# Patient Record
Sex: Male | Born: 1970
Health system: Southern US, Community
[De-identification: ages and names within clinical notes are randomized; demographics above are authoritative.]

## PROBLEM LIST (undated history)

## (undated) DIAGNOSIS — I209 Angina pectoris, unspecified: Secondary | ICD-10-CM

---

## 2016-01-05 DIAGNOSIS — A539 Syphilis, unspecified: Secondary | ICD-10-CM | POA: Diagnosis not present

## 2016-01-05 DIAGNOSIS — Z87891 Personal history of nicotine dependence: Secondary | ICD-10-CM | POA: Diagnosis not present

## 2016-01-05 DIAGNOSIS — Z79899 Other long term (current) drug therapy: Secondary | ICD-10-CM | POA: Diagnosis not present

## 2016-01-05 DIAGNOSIS — B20 Human immunodeficiency virus [HIV] disease: Secondary | ICD-10-CM | POA: Diagnosis not present

## 2016-02-22 MED FILL — ESTRADIOL 2 MG TABLET: 2 | 30 days supply | Qty: 60 | Fill #0

## 2016-02-22 MED FILL — SPIRONOLACTONE 25 MG TABLET: 25 | 30 days supply | Qty: 60 | Fill #0

## 2016-03-06 DIAGNOSIS — E349 Endocrine disorder, unspecified: Secondary | ICD-10-CM | POA: Diagnosis not present

## 2016-03-08 MED FILL — TRUVADA 200-300 MG TABS: 200-300 | 30 days supply | Qty: 30 | Fill #0

## 2016-03-08 MED FILL — PREZISTA 800 MG TABS: 800 | 30 days supply | Qty: 30 | Fill #0

## 2016-03-22 MED FILL — SPIRONOLACTONE 25 MG TABLET: 25 | 30 days supply | Qty: 60 | Fill #1

## 2016-03-22 MED FILL — ESTRADIOL 2 MG TABLET: 2 | 30 days supply | Qty: 60 | Fill #1

## 2016-04-04 DIAGNOSIS — Z21 Asymptomatic human immunodeficiency virus [HIV] infection status: Secondary | ICD-10-CM | POA: Diagnosis not present

## 2016-04-04 DIAGNOSIS — Z87891 Personal history of nicotine dependence: Secondary | ICD-10-CM | POA: Diagnosis not present

## 2016-04-04 DIAGNOSIS — F649 Gender identity disorder, unspecified: Secondary | ICD-10-CM | POA: Diagnosis not present

## 2016-04-04 DIAGNOSIS — B20 Human immunodeficiency virus [HIV] disease: Secondary | ICD-10-CM | POA: Diagnosis not present

## 2016-04-04 DIAGNOSIS — B079 Viral wart, unspecified: Secondary | ICD-10-CM | POA: Diagnosis not present

## 2016-04-07 MED FILL — GENVOYA TABLET: 150-150-200 | 30 days supply | Qty: 30 | Fill #0

## 2016-04-18 MED FILL — SPIRONOLACTONE 25 MG TABLET: 25 | 30 days supply | Qty: 60 | Fill #0

## 2016-04-18 MED FILL — ESTRADIOL 2 MG TABLET: 2 | 30 days supply | Qty: 60 | Fill #0

## 2016-05-16 MED FILL — GENVOYA TABLET: 150-150-200 | 30 days supply | Qty: 30 | Fill #1

## 2016-05-24 MED FILL — MELOXICAM 15 MG TABLET: 15 | 30 days supply | Qty: 30 | Fill #0

## 2016-06-14 MED FILL — GENVOYA TABLET: 150-150-200 | 30 days supply | Qty: 30 | Fill #2 | Status: TO

## 2016-06-14 MED FILL — ESTRADIOL 2 MG TABLET: 2 | 30 days supply | Qty: 60 | Fill #1

## 2016-06-21 MED FILL — MELOXICAM 15 MG TABLET: 15 | 30 days supply | Qty: 30 | Fill #1

## 2016-07-10 DIAGNOSIS — B20 Human immunodeficiency virus [HIV] disease: Secondary | ICD-10-CM | POA: Diagnosis not present

## 2021-05-15 ENCOUNTER — Inpatient Hospital Stay (HOSPITAL_COMMUNITY): Payer: No Typology Code available for payment source

## 2021-05-15 ENCOUNTER — Emergency Department (HOSPITAL_COMMUNITY): Payer: No Typology Code available for payment source

## 2021-05-15 ENCOUNTER — Encounter (HOSPITAL_COMMUNITY): Payer: Self-pay | Admitting: Emergency Medicine

## 2021-05-15 ENCOUNTER — Inpatient Hospital Stay (HOSPITAL_COMMUNITY)
Admission: EM | Admit: 2021-05-15 | Discharge: 2021-05-17 | DRG: 247 | Disposition: A | Discharge status: Home / Self Care | Payer: No Typology Code available for payment source | Attending: Cardiology | Admitting: Cardiology

## 2021-05-15 ENCOUNTER — Other Ambulatory Visit: Payer: Self-pay

## 2021-05-15 ENCOUNTER — Encounter (HOSPITAL_COMMUNITY)
Admission: EM | Disposition: A | Discharge status: Home / Self Care | Payer: Self-pay | Source: Home / Self Care | Attending: Cardiology

## 2021-05-15 DIAGNOSIS — I2129 ST elevation (STEMI) myocardial infarction involving other sites: Secondary | ICD-10-CM | POA: Diagnosis present

## 2021-05-15 DIAGNOSIS — F17210 Nicotine dependence, cigarettes, uncomplicated: Secondary | ICD-10-CM | POA: Diagnosis present

## 2021-05-15 DIAGNOSIS — E785 Hyperlipidemia, unspecified: Secondary | ICD-10-CM | POA: Diagnosis present

## 2021-05-15 DIAGNOSIS — Z955 Presence of coronary angioplasty implant and graft: Secondary | ICD-10-CM

## 2021-05-15 DIAGNOSIS — I25110 Atherosclerotic heart disease of native coronary artery with unstable angina pectoris: Secondary | ICD-10-CM | POA: Diagnosis present

## 2021-05-15 DIAGNOSIS — I2510 Atherosclerotic heart disease of native coronary artery without angina pectoris: Secondary | ICD-10-CM | POA: Diagnosis not present

## 2021-05-15 DIAGNOSIS — R0602 Shortness of breath: Secondary | ICD-10-CM

## 2021-05-15 DIAGNOSIS — B20 Human immunodeficiency virus [HIV] disease: Secondary | ICD-10-CM | POA: Diagnosis present

## 2021-05-15 DIAGNOSIS — Z79899 Other long term (current) drug therapy: Secondary | ICD-10-CM | POA: Diagnosis not present

## 2021-05-15 DIAGNOSIS — Z720 Tobacco use: Secondary | ICD-10-CM | POA: Diagnosis present

## 2021-05-15 DIAGNOSIS — I2121 ST elevation (STEMI) myocardial infarction involving left circumflex coronary artery: Principal | ICD-10-CM | POA: Diagnosis present

## 2021-05-15 DIAGNOSIS — I214 Non-ST elevation (NSTEMI) myocardial infarction: Secondary | ICD-10-CM | POA: Diagnosis present

## 2021-05-15 DIAGNOSIS — I213 ST elevation (STEMI) myocardial infarction of unspecified site: Secondary | ICD-10-CM | POA: Diagnosis present

## 2021-05-15 DIAGNOSIS — E781 Pure hyperglyceridemia: Secondary | ICD-10-CM | POA: Diagnosis present

## 2021-05-15 DIAGNOSIS — Z21 Asymptomatic human immunodeficiency virus [HIV] infection status: Secondary | ICD-10-CM | POA: Diagnosis present

## 2021-05-15 DIAGNOSIS — Z20822 Contact with and (suspected) exposure to covid-19: Secondary | ICD-10-CM | POA: Diagnosis present

## 2021-05-15 HISTORY — DX: Angina pectoris, unspecified: I20.9

## 2021-05-15 HISTORY — PX: LEFT HEART CATH AND CORONARY ANGIOGRAPHY: CATH118249

## 2021-05-15 HISTORY — PX: CORONARY/GRAFT ACUTE MI REVASCULARIZATION: CATH118305

## 2021-05-15 HISTORY — PX: CORONARY STENT INTERVENTION: CATH118234

## 2021-05-15 LAB — ECHOCARDIOGRAM COMPLETE
AR max vel: 1.68 cm2
AV Area VTI: 1.82 cm2
AV Area mean vel: 1.61 cm2
AV Mean grad: 6 mmHg
AV Peak grad: 10.6 mmHg
Ao pk vel: 1.63 m/s
Area-P 1/2: 5.13 cm2
Height: 64 in
P 1/2 time: 449 msec
S' Lateral: 3.3 cm
Weight: 2400 oz

## 2021-05-15 LAB — BASIC METABOLIC PANEL
Anion gap: 11 (ref 5–15)
BUN: 12 mg/dL (ref 6–20)
CO2: 22 mmol/L (ref 22–32)
Calcium: 9.1 mg/dL (ref 8.9–10.3)
Chloride: 106 mmol/L (ref 98–111)
Creatinine, Ser: 0.93 mg/dL (ref 0.61–1.24)
GFR, Estimated: >60 mL/min (ref >60)
Glucose, Bld: 104 mg/dL — ABNORMAL HIGH (ref 70–99)
Potassium: 3.9 mmol/L (ref 3.5–5.1)
Sodium: 139 mmol/L (ref 135–145)

## 2021-05-15 LAB — CBC
HCT: 42.4 % (ref 39.0–52.0)
HCT: 35.9 % — ABNORMAL LOW (ref 39.0–52.0)
Hemoglobin: 13.9 g/dL (ref 13.0–17.0)
Hemoglobin: 11.7 g/dL — ABNORMAL LOW (ref 13.0–17.0)
MCH: 26.2 pg (ref 26.0–34.0)
MCH: 25.9 pg — ABNORMAL LOW (ref 26.0–34.0)
MCHC: 32.8 g/dL (ref 30.0–36.0)
MCHC: 32.6 g/dL (ref 30.0–36.0)
MCV: 79.8 fL — ABNORMAL LOW (ref 80.0–100.0)
MCV: 79.4 fL — ABNORMAL LOW (ref 80.0–100.0)
Platelets: 254 10*3/uL (ref 150–400)
Platelets: 241 10*3/uL (ref 150–400)
RBC: 5.31 MIL/uL (ref 4.22–5.81)
RBC: 4.52 MIL/uL (ref 4.22–5.81)
RDW: 14.1 % (ref 11.5–15.5)
RDW: 13.6 % (ref 11.5–15.5)
WBC: 8.7 10*3/uL (ref 4.0–10.5)
WBC: 18.2 10*3/uL — ABNORMAL HIGH (ref 4.0–10.5)
nRBC: 0 % (ref 0.0–0.2)
nRBC: 0 % (ref 0.0–0.2)

## 2021-05-15 LAB — HEMOGLOBIN A1C
Hgb A1c MFr Bld: 6.3 % — ABNORMAL HIGH (ref 4.8–5.6)
Mean Plasma Glucose: 134.11 mg/dL

## 2021-05-15 LAB — CBG MONITORING, ED: Glucose-Capillary: 127 mg/dL — ABNORMAL HIGH (ref 70–99)

## 2021-05-15 LAB — LIPID PANEL
Cholesterol: 112 mg/dL (ref 0–200)
HDL: 44 mg/dL (ref >40)
LDL Cholesterol: 61 mg/dL (ref 0–99)
Total CHOL/HDL Ratio: 2.5 RATIO
Triglycerides: 33 mg/dL (ref <150)
VLDL: 7 mg/dL (ref 0–40)

## 2021-05-15 LAB — TROPONIN I (HIGH SENSITIVITY)
Troponin I (High Sensitivity): 3730 ng/L (ref <18)
Troponin I (High Sensitivity): 3110 ng/L (ref <18)

## 2021-05-15 LAB — TSH: TSH: 1.049 u[IU]/mL (ref 0.350–4.500)

## 2021-05-15 LAB — D-DIMER, QUANTITATIVE: D-Dimer, Quant: 0.57 ug/mL-FEU — ABNORMAL HIGH (ref 0.00–0.50)

## 2021-05-15 LAB — RESP PANEL BY RT-PCR (FLU A&B, COVID) ARPGX2
Influenza A by PCR: NEGATIVE
Influenza B by PCR: NEGATIVE
SARS Coronavirus 2 by RT PCR: NEGATIVE

## 2021-05-15 LAB — MRSA PCR SCREENING: MRSA by PCR: NEGATIVE

## 2021-05-15 LAB — HIV ANTIBODY (ROUTINE TESTING W REFLEX): HIV Screen 4th Generation wRfx: REACTIVE — AB

## 2021-05-15 SURGERY — CORONARY/GRAFT ACUTE MI REVASCULARIZATION
Anesthesia: LOCAL

## 2021-05-15 MED ORDER — ASPIRIN EC 81 MG PO TBEC
81.0000 mg | DELAYED_RELEASE_TABLET | Freq: Every day | ORAL | Status: DC
  Administered 2021-05-16 – 2021-05-17 (×2): 81 mg via ORAL
  Filled 2021-05-15 (×2): qty 1

## 2021-05-15 MED ORDER — ATORVASTATIN CALCIUM 80 MG PO TABS
80.0000 mg | ORAL_TABLET | Freq: Every day | ORAL | Status: DC
  Administered 2021-05-15 – 2021-05-17 (×3): 80 mg via ORAL
  Filled 2021-05-15 (×3): qty 1

## 2021-05-15 MED ORDER — NITROGLYCERIN IN D5W 200-5 MCG/ML-% IV SOLN
0.0000 ug/min | INTRAVENOUS | Status: DC
  Administered 2021-05-15: 5 ug/min via INTRAVENOUS
  Filled 2021-05-15: qty 250

## 2021-05-15 MED ORDER — NITROGLYCERIN 1 MG/10 ML FOR IR/CATH LAB
INTRA_ARTERIAL | Status: AC
  Filled 2021-05-15: qty 10

## 2021-05-15 MED ORDER — HYDROMORPHONE HCL 1 MG/ML IJ SOLN
1.0000 mg | Freq: Once | INTRAMUSCULAR | Status: AC
  Administered 2021-05-15: 1 mg via INTRAVENOUS
  Filled 2021-05-15: qty 1

## 2021-05-15 MED ORDER — VERAPAMIL HCL 2.5 MG/ML IV SOLN
INTRAVENOUS | Status: AC
  Filled 2021-05-15: qty 2

## 2021-05-15 MED ORDER — FENTANYL CITRATE (PF) 100 MCG/2ML IJ SOLN
INTRAMUSCULAR | Status: DC | PRN
  Administered 2021-05-15: 50 ug via INTRAVENOUS

## 2021-05-15 MED ORDER — TICAGRELOR 90 MG PO TABS
ORAL_TABLET | ORAL | Status: DC | PRN
  Administered 2021-05-15: 180 mg via ORAL

## 2021-05-15 MED ORDER — ALPRAZOLAM 0.5 MG PO TABS
0.5000 mg | ORAL_TABLET | Freq: Once | ORAL | Status: AC
  Administered 2021-05-15: 0.5 mg via ORAL
  Filled 2021-05-15: qty 1

## 2021-05-15 MED ORDER — ONDANSETRON HCL 4 MG/2ML IJ SOLN: 4.0000 mg | Freq: Four times a day (QID) | INTRAMUSCULAR | Status: DC | PRN

## 2021-05-15 MED ORDER — NITROGLYCERIN 0.4 MG SL SUBL
0.4000 mg | SUBLINGUAL_TABLET | SUBLINGUAL | Status: AC | PRN
  Administered 2021-05-15 (×3): 0.4 mg via SUBLINGUAL
  Filled 2021-05-15: qty 1

## 2021-05-15 MED ORDER — ENOXAPARIN SODIUM 40 MG/0.4ML IJ SOSY
40.0000 mg | PREFILLED_SYRINGE | INTRAMUSCULAR | Status: DC
  Administered 2021-05-16 – 2021-05-17 (×2): 40 mg via SUBCUTANEOUS
  Filled 2021-05-15 (×2): qty 0.4

## 2021-05-15 MED ORDER — SODIUM CHLORIDE 0.9 % IV SOLN: 250.0000 mL | INTRAVENOUS | Status: DC | PRN

## 2021-05-15 MED ORDER — HEPARIN (PORCINE) IN NACL 1000-0.9 UT/500ML-% IV SOLN
INTRAVENOUS | Status: AC
  Filled 2021-05-15: qty 1000

## 2021-05-15 MED ORDER — IOHEXOL 350 MG/ML SOLN
INTRAVENOUS | Status: DC | PRN
  Administered 2021-05-15: 80 mL

## 2021-05-15 MED ORDER — BICTEGRAVIR-EMTRICITAB-TENOFOV 50-200-25 MG PO TABS
1.0000 | ORAL_TABLET | Freq: Every day | ORAL | Status: DC
  Administered 2021-05-15 – 2021-05-17 (×3): 1 via ORAL
  Filled 2021-05-15 (×3): qty 1

## 2021-05-15 MED ORDER — MORPHINE SULFATE (PF) 2 MG/ML IV SOLN
2.0000 mg | INTRAVENOUS | Status: DC | PRN
  Administered 2021-05-15: 2 mg via INTRAVENOUS
  Filled 2021-05-15: qty 1

## 2021-05-15 MED ORDER — HEPARIN (PORCINE) IN NACL 1000-0.9 UT/500ML-% IV SOLN
INTRAVENOUS | Status: DC | PRN
  Administered 2021-05-15: 500 mL

## 2021-05-15 MED ORDER — LIDOCAINE HCL (PF) 1 % IJ SOLN
INTRAMUSCULAR | Status: AC
  Filled 2021-05-15: qty 30

## 2021-05-15 MED ORDER — IPRATROPIUM-ALBUTEROL 0.5-2.5 (3) MG/3ML IN SOLN
3.0000 mL | Freq: Four times a day (QID) | RESPIRATORY_TRACT | Status: DC | PRN
  Administered 2021-05-15 – 2021-05-16 (×2): 3 mL via RESPIRATORY_TRACT
  Filled 2021-05-15 (×2): qty 3

## 2021-05-15 MED ORDER — ASPIRIN 81 MG PO CHEW
324.0000 mg | CHEWABLE_TABLET | ORAL | Status: AC
  Administered 2021-05-15: 324 mg via ORAL
  Filled 2021-05-15: qty 4

## 2021-05-15 MED ORDER — ASPIRIN 300 MG RE SUPP: 300.0000 mg | RECTAL | Status: AC

## 2021-05-15 MED ORDER — LORAZEPAM 2 MG/ML IJ SOLN
0.5000 mg | Freq: Once | INTRAMUSCULAR | Status: AC
  Administered 2021-05-15: 0.5 mg via INTRAVENOUS
  Filled 2021-05-15: qty 1

## 2021-05-15 MED ORDER — HYDROMORPHONE HCL 1 MG/ML IJ SOLN
0.5000 mg | Freq: Once | INTRAMUSCULAR | Status: AC
  Administered 2021-05-15: 0.5 mg via INTRAVENOUS
  Filled 2021-05-15: qty 1

## 2021-05-15 MED ORDER — HEPARIN (PORCINE) 25000 UT/250ML-% IV SOLN
950.0000 [IU]/h | INTRAVENOUS | Status: DC
  Administered 2021-05-15: 950 [IU]/h via INTRAVENOUS
  Filled 2021-05-15: qty 250

## 2021-05-15 MED ORDER — HEPARIN BOLUS VIA INFUSION
4000.0000 [IU] | Freq: Once | INTRAVENOUS | Status: AC
  Administered 2021-05-15: 4000 [IU] via INTRAVENOUS
  Filled 2021-05-15: qty 4000

## 2021-05-15 MED ORDER — ACETAMINOPHEN 325 MG PO TABS
650.0000 mg | ORAL_TABLET | ORAL | Status: DC | PRN
  Administered 2021-05-15: 650 mg via ORAL
  Filled 2021-05-15: qty 2

## 2021-05-15 MED ORDER — TICAGRELOR 90 MG PO TABS
90.0000 mg | ORAL_TABLET | Freq: Two times a day (BID) | ORAL | Status: DC
  Administered 2021-05-15 – 2021-05-17 (×4): 90 mg via ORAL
  Filled 2021-05-15 (×4): qty 1

## 2021-05-15 MED ORDER — VERAPAMIL HCL 2.5 MG/ML IV SOLN
INTRAVENOUS | Status: DC | PRN
  Administered 2021-05-15: 10 mL via INTRA_ARTERIAL

## 2021-05-15 MED ORDER — PERFLUTREN LIPID MICROSPHERE
1.0000 mL | INTRAVENOUS | Status: AC | PRN
  Administered 2021-05-15: 4 mL via INTRAVENOUS
  Filled 2021-05-15: qty 10

## 2021-05-15 MED ORDER — SODIUM CHLORIDE 0.9% FLUSH
3.0000 mL | Freq: Two times a day (BID) | INTRAVENOUS | Status: DC
  Administered 2021-05-15 – 2021-05-17 (×4): 3 mL via INTRAVENOUS

## 2021-05-15 MED ORDER — SODIUM CHLORIDE 0.9% FLUSH: 3.0000 mL | INTRAVENOUS | Status: DC | PRN

## 2021-05-15 MED ORDER — FENTANYL CITRATE (PF) 100 MCG/2ML IJ SOLN
INTRAMUSCULAR | Status: AC
  Filled 2021-05-15: qty 2

## 2021-05-15 MED ORDER — LIDOCAINE HCL (PF) 1 % IJ SOLN
INTRAMUSCULAR | Status: DC | PRN
  Administered 2021-05-15: 2 mL

## 2021-05-15 MED ORDER — METOPROLOL TARTRATE 5 MG/5ML IV SOLN
5.0000 mg | Freq: Once | INTRAVENOUS | Status: AC
  Administered 2021-05-15: 5 mg via INTRAVENOUS
  Filled 2021-05-15: qty 5

## 2021-05-15 MED ORDER — HEPARIN SODIUM (PORCINE) 1000 UNIT/ML IJ SOLN
INTRAMUSCULAR | Status: AC
  Filled 2021-05-15: qty 1

## 2021-05-15 MED ORDER — METOPROLOL TARTRATE 12.5 MG HALF TABLET
12.5000 mg | ORAL_TABLET | Freq: Two times a day (BID) | ORAL | Status: DC
  Administered 2021-05-15 – 2021-05-16 (×4): 12.5 mg via ORAL
  Filled 2021-05-15 (×4): qty 1

## 2021-05-15 MED ORDER — SODIUM CHLORIDE 0.9 % WEIGHT BASED INFUSION
1.0000 mL/kg/h | INTRAVENOUS | Status: AC
  Administered 2021-05-15: 1 mL/kg/h via INTRAVENOUS

## 2021-05-15 MED ORDER — HEPARIN SODIUM (PORCINE) 1000 UNIT/ML IJ SOLN
INTRAMUSCULAR | Status: DC | PRN
  Administered 2021-05-15: 6000 [IU] via INTRAVENOUS

## 2021-05-15 MED ORDER — MORPHINE SULFATE (PF) 2 MG/ML IV SOLN
2.0000 mg | Freq: Once | INTRAVENOUS | Status: AC
  Administered 2021-05-15: 2 mg via INTRAVENOUS
  Filled 2021-05-15: qty 1

## 2021-05-15 MED ORDER — MORPHINE SULFATE (PF) 2 MG/ML IV SOLN
2.0000 mg | INTRAVENOUS | Status: DC | PRN
  Administered 2021-05-16 (×2): 2 mg via INTRAVENOUS
  Filled 2021-05-15 (×3): qty 1

## 2021-05-15 MED ORDER — SODIUM CHLORIDE 0.9 % IV SOLN
INTRAVENOUS | Status: AC | PRN
  Administered 2021-05-15: 10 mL/h via INTRAVENOUS

## 2021-05-15 MED ORDER — MORPHINE SULFATE (PF) 4 MG/ML IV SOLN
4.0000 mg | Freq: Once | INTRAVENOUS | Status: AC
  Administered 2021-05-15: 4 mg via INTRAVENOUS
  Filled 2021-05-15: qty 1

## 2021-05-15 SURGICAL SUPPLY — 21 items
BALLN SAPPHIRE 2.5X12 (BALLOONS) ×2
BALLN ~~LOC~~ EMERGE MR 3.75X12 (BALLOONS) ×2
BALLOON SAPPHIRE 2.5X12 (BALLOONS) ×1 IMPLANT
BALLOON ~~LOC~~ EMERGE MR 3.75X12 (BALLOONS) ×1 IMPLANT
CATH 5FR JL3.5 JR4 ANG PIG MP (CATHETERS) ×2 IMPLANT
CATH LAUNCHER 6FR EBU3.5 (CATHETERS) ×2 IMPLANT
DEVICE RAD COMP TR BAND LRG (VASCULAR PRODUCTS) ×2 IMPLANT
DEVICE RAD TR BAND REGULAR (VASCULAR PRODUCTS) ×2 IMPLANT
ELECT DEFIB PAD ADLT CADENCE (PAD) ×2 IMPLANT
GLIDESHEATH SLEND SS 6F .021 (SHEATH) ×2 IMPLANT
GUIDEWIRE INQWIRE 1.5J.035X260 (WIRE) ×1 IMPLANT
INQWIRE 1.5J .035X260CM (WIRE) ×2
KIT ENCORE 26 ADVANTAGE (KITS) ×2 IMPLANT
KIT HEART LEFT (KITS) ×2 IMPLANT
PACK CARDIAC CATHETERIZATION (CUSTOM PROCEDURE TRAY) ×2 IMPLANT
SHEATH PROBE COVER 6X72 (BAG) ×2 IMPLANT
STENT SYNERGY XD 3.50X20 (Permanent Stent) ×1 IMPLANT
SYNERGY XD 3.50X20 (Permanent Stent) ×2 IMPLANT
TRANSDUCER W/STOPCOCK (MISCELLANEOUS) ×2 IMPLANT
TUBING CIL FLEX 10 FLL-RA (TUBING) ×2 IMPLANT
WIRE ASAHI PROWATER 180CM (WIRE) ×2 IMPLANT

## 2021-05-15 NOTE — Plan of Care (Addendum)
RN reports patient with wheezing. Smoker. Duoneb q6h PRN ordered.   RN report patient hematuria, no Foley. Patient examined, small amount brown stain noted on urinal, no dysuria or urgency. CD4 745 on 10/28/20.  He is resting in bed. VSS. Chart reviewed, CBC WNL POA. S/P ASA and Brilinta load + heparin bolus for STEMI today. Advised RN to obtain CBC, UA, monitor for hematuria for worsening. Hold Lovenox SQ for DVT prophylaxis if hematuria worsens.

## 2021-05-15 NOTE — H&P (Addendum)
Cardiology Admission History and Physical:   Mayo ID: Christopher Mayo MRN: [id number]; DOB: 12-Sep-1971   Admission date: 05/15/2021  Primary Care Provider: Skip Mayer, MD [facility] Cardiologist: None  [facility] Electrophysiologist:  None   Chief Complaint: chest pain  Mayo Profile:   Christopher Mayo is a 50 y.o. male with HIV on tx who presents with acute chest pain and is admitted for NSTEMI.   History of Present Illness:   Christopher Mayo reports that around on 05/21 he developed acute onset left upper neck pain which quickly migrated to his substernal center of Christopher chest with 9/10 severity.  He had never experienced anything like this before and had no prior history of CAD or other comorbidities besides HIV which he was diagnosed with in his 26s and has been well controlled.  He works at an [employer] and is fairly active.  He does smoke several cigarettes per day (max 1 ppd for short periods of time, currently 2-3 cigarettes daily) most of his life along with occasional marijuana use but denies any other nonprescription drugs including cocaine.  His chest discomfort has been persistent since its onset.  He denies any family history of CAD or premature sudden cardiac death.  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  Sometimes Christopher job is physically demanding although yesterday he said that he was working gait and that was less strenuous than his typical day.  He denies any other risk factors for CAD such as HTN, HLD, or DM2.  During my evaluation he was still in 9/10 chest pain and had not yet received pain medications or sublingual nitroglycerin.  IV nitroglycerin had been started at 10 mcg with no symptom improvement.  He was fairly rapidly uptitrated on nitroglycerin with adjunctive pain medications and his pain was  somewhat improved but still moderate in severity.  Past Medical History:  Diagnosis Date  . Anginal pain ([place])     History reviewed. No pertinent surgical history.   Medications Prior to Admission: Prior to Admission medications   Medication Sig Start Date End Date Taking? Authorizing Provider  BIKTARVY 50-200-25 MG TABS tablet Take 1 tablet by mouth daily. 04/18/21  Yes [provider]    Allergies:   No Known Allergies  Social History:   Social History   Socioeconomic History  . Marital status: Single    Spouse name: Not on file  . Number of children: Not on file  . Years of education: Not on file  . Highest education level: Not on file  Occupational History  . Not on file  Tobacco Use  . Smoking status: Current Every Day Smoker    Packs/day: 0.25    Years: 35.00    Pack years: 8.75  . Smokeless tobacco: Never Used  Substance and Sexual Activity  . Alcohol use: Not Currently  . Drug use: Not on file  . Sexual activity: Not on file  Other Topics Concern  . Not on file  Social History Narrative  . Not on file   Social Determinants of Health   Financial Resource Strain: Not on file  Food Insecurity: Not on file  Transportation Needs: Not on file  Physical Activity: Not on file  Stress: Not on file  Social Connections: Not on file  Intimate Partner Violence: Not on file  Family History:   Christopher Mayo's family history is not on file.    ROS:   Review of Systems: [y] = yes, _0  = no       General: Weight gain _1 ; Weight loss _2 ; Anorexia _3 ; Fatigue _4 ; Fever _5 ; Chills _6 ; Weakness _7     Cardiac: Chest pain/pressure _8 ; Resting SOB _9 ; Exertional SOB _10 ; Orthopnea _11 ; Pedal Edema _12 ; Palpitations _13 ; Syncope _14 ; Presyncope _15 ; Paroxysmal nocturnal dyspnea _16     Pulmonary: Cough _17 ; Wheezing _18 ; Hemoptysis _19 ; Sputum _20 ; Snoring _21     GI: Vomiting _22 ; Dysphagia _23 ; Melena _24 ; Hematochezia _25 ; Heartburn _26 ; Abdominal  pain _27 ; Constipation _28 ; Diarrhea _29 ; BRBPR _30     GU: Hematuria _31 ; Dysuria _32 ; Nocturia _33   Vascular: Pain in legs with walking _34 ; Pain in feet with lying flat _35 ; Non-healing sores _36 ; Stroke _37 ; TIA _38 ; Slurred speech _39 ;    Neuro: Headaches _40 ; Vertigo _41 ; Seizures _42 ; Paresthesias _43 ;Blurred vision _44 ; Diplopia _45 ; Vision changes _46     Ortho/Skin: Arthritis _47 ; Joint pain _48 ; Muscle pain _49 ; Joint swelling _50 ; Back Pain _51 ; Rash _52     Psych: Depression _53 ; Anxiety _54     Heme: Bleeding problems _55 ; Clotting disorders _56 ; Anemia _57     Endocrine: Diabetes _58 ; Thyroid dysfunction _59    Physical Exam/Data:   Vitals:   05/15/21 0147 05/15/21 0223 05/15/21 0230 05/15/21 0400  BP: 115/66 131/76 121/76 109/75  Pulse: (!) 101 (!) 103    Resp:  (!) 35 (!) 38 (!) 36  Temp:      TempSrc:      SpO2:  96%    Weight:      Height:       No intake or output data in Christopher 24 hours ending 05/15/21 0519 Last 3 Weights 05/15/2021  Weight (lbs) 150 lb  Weight (kg) 68.04 kg     Body mass index is 25.75 kg/m.  General:  Well nourished, well developed, in no acute distress HEENT: normal Lymph: no adenopathy Neck: no JVD Endocrine:  No thryomegaly Vascular: No carotid bruits; FA pulses 2+ bilaterally without bruits  Cardiac:  normal S1, S2; RRR; no murmur  Lungs:  clear to auscultation bilaterally, no wheezing, rhonchi or rales  Abd: soft, nontender, no hepatomegaly  Ext: no LE edema Musculoskeletal:  No deformities, BUE and BLE strength normal and equal Skin: warm and dry  Neuro:  CNs 2-12 intact, no focal abnormalities noted Psych:  Normal affect   EKG:  Christopher ECG that was done was personally reviewed and demonstrates sinus tachycardia without ischemic changes  Relevant CV Studies: None   Laboratory Data:  High Sensitivity Troponin:   Recent Labs  Lab 05/15/21 0038 05/15/21 0216  TROPONINIHS 3,730* 3,110*      Chemistry Recent Labs  Lab  05/15/21 0038  NA 139  K 3.9  CL 106  CO2 22  GLUCOSE 104*  BUN 12  CREATININE 0.93  CALCIUM 9.1  GFRNONAA >60  ANIONGAP 11    No results for input(s): PROT, ALBUMIN, AST, ALT, ALKPHOS, BILITOT in Christopher last 168 hours. Hematology Recent Labs  Lab 05/15/21 0038  WBC 8.7  RBC 5.31  HGB 13.9  HCT 42.4  MCV 79.8*  MCH 26.2  MCHC 32.8  RDW 14.1  PLT 254   BNPNo results for input(s): BNP, PROBNP in Christopher last 168 hours.  DDimer  Recent Labs  Lab 05/15/21 0038  DDIMER 0.57*   Radiology/Studies:  DG Chest 2 View  Result Date: 05/15/2021 CLINICAL DATA:  Chest pain radiating to Christopher left shoulder. EXAM: CHEST - 2 VIEW COMPARISON:  None. FINDINGS: Low lung volumes are seen with mild areas of atelectasis noted within Christopher bilateral lung bases. There is no evidence of a pleural effusion or pneumothorax. Christopher heart size and mediastinal contours are within normal limits. Christopher visualized skeletal structures are unremarkable. IMPRESSION: Low lung volumes with mild bibasilar atelectasis. Electronically Signed   By: Virgina Norfolk M.D.   On: 05/15/2021 00:51     TIMI Risk Score for Unstable Angina or Non-ST Elevation MI:   Christopher Mayo's TIMI risk score is 3, which indicates a 13% risk of all cause mortality, new or recurrent myocardial infarction or need for urgent revascularization in Christopher next 14 days.{  Assessment and Plan:   1. NSTEMI Christopher Mayo presents with cardiac chest pain along with significantly elevated hsT and dynamic ECG changes all consistent with NSTEMI.  His risk factors are HIV and long-term tobacco use.  He denies any history of HTN, HLD, or DM2.  He does not frequently go to a primary care physician but does follow-up with his infectious disease physician and has had undetectable viral loads on almost all labs dating back to 2014.  He did have elevated triglycerides in 2014 (TG 354) with otherwise normal lipid studies.  We will plan to get an echo today and coronary angio  with likely PCI on Monday.  He had borderline ST elevations in his lateral leads and although neither met criteria he also had subtle elevations in V1 and 1 mm in V2 that were not present on admission ECGs.  His troponin peaked (3730) and is now down trending (3110).  Fortunately Christopher combination of escalating doses of IV nitroglycerin and prn Dilaudid have adequately controlled his chest discomfort at this point.  Reviewed medication interactions and none of Christopher below medications have documented reactions with Biktarvy. - TTE ordered  - continue NG gtt, currently 3/10 severity, at 120 mcg/min - start ASA 81 mg PO daily, s/p 324 mg PO in transit - continue heparin gtt - start atorva 80 mg PO daily  - hold ACEi for BP room while on NG IV and borderline hypotensive - sinus tach, s/p metop tartrate 5 mg IV, start metop tartrate 12.5 mg PO q12h - lipids/A1c/TSH pending  - continue biktarvy 50-200-25 mg PO daily  - NPO Sun night for coronary evaluation Monday   Severity of Illness: Christopher appropriate Mayo status for this Mayo is INPATIENT. Inpatient status is judged to be reasonable and necessary in order to provide Christopher required intensity of service to ensure Christopher Mayo's safety. Christopher Mayo's presenting symptoms, physical exam findings, and initial radiographic and laboratory data in Christopher context of their chronic comorbidities is felt to place them at high risk for further clinical deterioration. Furthermore, it is not anticipated that Christopher Mayo will be medically stable for discharge from Christopher hospital within 2 midnights of admission. Christopher following factors support Christopher Mayo status of inpatient.   " Christopher Mayo's presenting symptoms include chest pain. " Christopher worrisome physical exam findings include chest pain. " Christopher initial radiographic and laboratory data are worrisome because  of troponin elevation/ECG changes. " Christopher chronic co-morbidities include HIV, tobacco use.  * I certify that at Christopher point  of admission it is my clinical judgment that Christopher Mayo will require inpatient hospital care spanning beyond 2 midnights from Christopher point of admission due to high intensity of service, high risk for further deterioration and high frequency of surveillance required.*   For questions or updates, please contact [place] Please consult [email or web address] for contact info under   Signed, Dion Body, MD  05/15/2021 5:19 AM

## 2021-05-15 NOTE — ED Notes (Signed)
IV team at pt bedside 

## 2021-05-15 NOTE — Progress Notes (Incomplete)
  Echocardiogram 2D Echocardiogram has been performed.  Christopher Mayo 05/15/2021, 2:48 PM

## 2021-05-15 NOTE — ED Notes (Signed)
Cardiology at bedside reviewing ekg and discussing cath lab with pt

## 2021-05-15 NOTE — ED Provider Notes (Signed)
MSE was initiated and I personally evaluated the patient and placed orders (if any) at  12:27 AM on May 15, 2021.    ROS: As listed above  PE: Alert and oriented Answers questions appropriately No respiratory distress   Discussed with patient that their care has been initiated.   They are counseled that they will need to remain in the ED until the completion of their workup, including full H&P and results of any tests.  Risks of leaving the emergency department prior to completion of treatment were discussed. Patient was advised to inform ED staff if they are leaving before their treatment is complete. The patient acknowledged these risks and time was allowed for questions.    The patient appears stable so that the remainder of the MSE may be completed by another provider.    Roxy Horseman, PA-C 05/15/21 0126    Sabas Sous, MD 05/15/21 [phone]

## 2021-05-15 NOTE — Progress Notes (Incomplete)
Patient c/o 3/10 chest pain and shortness of breath. O2 sats 100%, RR 35, BP 112/71. Nitro given x 3, 0/10 CP after 3rd nitro. EKG obtained, NP  and MD notified. Orders for morphine and chest xray.  Patient now resting comfortably.

## 2021-05-15 NOTE — ED Triage Notes (Signed)
Brought in by [facility] - sudden onset of chest pain radiating to left shoulder. Pain 7/10   Aspirin 324mg  given. bp 134/90

## 2021-05-15 NOTE — ED Notes (Signed)
Pt changed out of street clothes and into gown

## 2021-05-15 NOTE — Significant Event (Signed)
Reviewed chart at 0725 and concerned about 0554 ECG ordered in ED with evolving I/aVL and V2 elevation. Assessed patient at bedside and he felt that his pain was under better control and he went to sleep but upon waking up around 0600 he started to experience recurrence/worsening CP despite NG 100 mcg/min IV. Obtained stat ECG at bedside and confirmed anterolateral STEMI. Activated cath lab for urgent coronary evaluation. Increased NG to 130 mcg and patient CP 1/10 severity.

## 2021-05-15 NOTE — ED Notes (Signed)
Cardiologist at pt bedside °

## 2021-05-15 NOTE — ED Notes (Signed)
Pt tachypneic and c/o SOB. Oxygen 92%, RR 42. Placed on 2L [place], oxygen up to 100%.

## 2021-05-15 NOTE — ED Provider Notes (Signed)
[facility] EMERGENCY DEPARTMENT Provider Note   CSN: [id number] Arrival date & time: 05/15/21  0015     History Chief Complaint  Patient presents with  . Chest Pain    Christopher Mayo is a 50 y.o. male.  Patient with past medical history of HIV, presents to the emergency department with a chief complaint of central chest pain that radiates to his left arm and shoulder.  He states his symptoms started this evening.  He reports associated nausea.  States that the pain takes his breath away.  Denies any fever, chills, or productive cough.  Denies any association with eating.  Rates his pain as a 7 out of 10.  He was given 324 mg of aspirin by EMS.  The history is provided by the patient. No language interpreter was used.       No past medical history on file.  There are no problems to display for this patient.   History reviewed. No pertinent surgical history.     No family history on file.     Home Medications Prior to Admission medications   Medication Sig Start Date End Date Taking? Authorizing Provider  BIKTARVY 50-200-25 MG TABS tablet Take 1 tablet by mouth daily. 04/18/21  Yes [provider]    Allergies    Patient has no known allergies.  Review of Systems   Review of Systems  All other systems reviewed and are negative.   Physical Exam Updated Vital Signs BP 124/78 (BP Location: Right Arm)   Pulse (!) 101   Temp 98.6 F (37 C) (Oral)   Resp (!) 28   Ht 5\' 4"  (1.626 m)   Wt 68 kg   SpO2 95%   BMI 25.75 kg/m   Physical Exam Vitals and nursing note reviewed.  Constitutional:      Appearance: He is well-developed.     Comments: Uncomfortable appearing  HENT:     Head: Normocephalic and atraumatic.  Eyes:     Conjunctiva/sclera: Conjunctivae normal.  Cardiovascular:     Rate and Rhythm: Regular rhythm. Tachycardia present.     Heart sounds: No murmur heard.   Pulmonary:     Effort: Pulmonary effort is  normal. No respiratory distress.     Breath sounds: Normal breath sounds.  Abdominal:     Palpations: Abdomen is soft.     Tenderness: There is no abdominal tenderness.  Musculoskeletal:        General: Normal range of motion.     Cervical back: Neck supple.  Skin:    General: Skin is warm and dry.  Neurological:     Mental Status: He is alert and oriented to person, place, and time.  Psychiatric:        Mood and Affect: Mood normal.        Behavior: Behavior normal.     ED Results / Procedures / Treatments   Labs (all labs ordered are listed, but only abnormal results are displayed) Labs Reviewed  CBC - Abnormal; Notable for the following components:      Result Value   MCV 79.8 (*)    All other components within normal limits  D-DIMER, QUANTITATIVE - Abnormal; Notable for the following components:   D-Dimer, Quant 0.57 (*)    All other components within normal limits  BASIC METABOLIC PANEL  TROPONIN I (HIGH SENSITIVITY)    EKG EKG Interpretation  Date/Time:  Sunday May 15 2021 01:47:36 EDT Ventricular Rate:  99 PR  Interval:  159 QRS Duration: 111 QT Interval:  325 QTC Calculation: 417 R Axis:   43 Text Interpretation: Sinus rhythm Probable left atrial enlargement Incomplete RBBB and LAFB ST T changes inferiorly concerning for ischemia Confirmed by Nicanor Alcon, April ([id number]) on 05/15/2021 1:55:17 AM   Radiology DG Chest 2 View  Result Date: 05/15/2021 CLINICAL DATA:  Chest pain radiating to the left shoulder. EXAM: CHEST - 2 VIEW COMPARISON:  None. FINDINGS: Low lung volumes are seen with mild areas of atelectasis noted within the bilateral lung bases. There is no evidence of a pleural effusion or pneumothorax. The heart size and mediastinal contours are within normal limits. The visualized skeletal structures are unremarkable. IMPRESSION: Low lung volumes with mild bibasilar atelectasis. Electronically Signed   By: Aram Candela M.D.   On: 05/15/2021 00:51     Procedures .Critical Care Performed by: Roxy Horseman, PA-C Authorized by: Roxy Horseman, PA-C   Critical care provider statement:    Critical care time (minutes):  55   Critical care was necessary to treat or prevent imminent or life-threatening deterioration of the following conditions:  Circulatory failure   Critical care was time spent personally by me on the following activities:  Discussions with consultants, evaluation of patient's response to treatment, examination of patient, ordering and performing treatments and interventions, ordering and review of laboratory studies, ordering and review of radiographic studies, pulse oximetry, re-evaluation of patient's condition, obtaining history from patient or surrogate and review of old charts     Medications Ordered in ED Medications  morphine 4 MG/ML injection 4 mg (has no administration in time range)    ED Course  I have reviewed the triage vital signs and the nursing notes.  Pertinent labs & imaging results that were available during my care of the patient were reviewed by me and considered in my medical decision making (see chart for details).    MDM Rules/Calculators/A&P                          This patient complains of CP, this involves an extensive number of treatment options, and is a complaint that carries with it a high risk of complications and morbidity.    Differential Dx ACS, PE, dissection, pneumothorax, COVID, GERD  Pertinent Labs I ordered, reviewed, and interpreted labs, D-dimer mildly elevated, CBC notable for no leukocytosis, trop is 3730.  Imaging Interpretation I ordered imaging studies which included chest x-ray.  I independently visualized and interpreted the cxr, which showed no obvious abnormality.  Radiologist notes mild atelectasis.  Medications I ordered medication morphine for pain.  Critical Interventions  Heparin and nitro  Reassessments After the interventions stated above,  I reevaluated the patient and found patient still having pain.  Ordered nitro.  Consultants Dr. Early Osmond, from cardiology, who will come to admit for NSTEMI.  Plan Admit.    Final Clinical Impression(s) / ED Diagnoses Final diagnoses:  NSTEMI (non-ST elevated myocardial infarction) [facility])    Rx / DC Orders ED Discharge Orders    None       Roxy Horseman, PA-C 05/15/21 0201    Palumbo, April, MD 05/15/21 0301

## 2021-05-15 NOTE — Progress Notes (Signed)
ANTICOAGULATION CONSULT NOTE - Initial Consult  Pharmacy Consult for Heparin Indication: chest pain/ACS  No Known Allergies  Patient Measurements: Height: 5\' 4"  (162.6 cm) Weight: 68 kg (150 lb) IBW/kg (Calculated) : 59.2 Heparin Dosing Weight: 68 kg  Vital Signs: Temp: 98.6 F (37 C) (05/22 0017) Temp Source: Oral (05/22 0017) BP: 124/78 (05/22 0058) Pulse Rate: 101 (05/22 0058)  Labs: Recent Labs    05/15/21 0038  HGB 13.9  HCT 42.4  PLT 254  CREATININE 0.93  TROPONINIHS 3,730*    Estimated Creatinine Clearance: 79.6 mL/min (by C-G formula based on SCr of 0.93 mg/dL).   Medical History: No past medical history on file.  Medications:  See electronic med rec  Assessment: 50 y.o. M presents with CP. To begin heparin per pharmacy. No AC PTA. CBC ok on admission.  Goal of Therapy:  Heparin level 0.3-0.7 units/ml Monitor platelets by anticoagulation protocol: Yes   Plan:  Heparin IV bolus 4000 units Heparin gtt at 950 units/hr Will f/u heparin level in 6 hours Daily heparin level and CBC  44, PharmD, BCPS Please see amion for complete clinical pharmacist phone list 05/15/2021,1:51 AM

## 2021-05-15 NOTE — ED Notes (Signed)
Dr. Nicanor Alcon made aware of pt critical troponin value of 3,730

## 2021-05-16 ENCOUNTER — Encounter (HOSPITAL_COMMUNITY): Payer: Self-pay | Admitting: Cardiology

## 2021-05-16 ENCOUNTER — Other Ambulatory Visit (HOSPITAL_COMMUNITY): Payer: Self-pay

## 2021-05-16 LAB — URINALYSIS, ROUTINE W REFLEX MICROSCOPIC
Bilirubin Urine: NEGATIVE
Glucose, UA: NEGATIVE mg/dL
Ketones, ur: 20 mg/dL — AB
Leukocytes,Ua: NEGATIVE
Nitrite: NEGATIVE
Protein, ur: 100 mg/dL — AB
Specific Gravity, Urine: 1.02 (ref 1.005–1.030)
pH: 5 (ref 5.0–8.0)

## 2021-05-16 LAB — BASIC METABOLIC PANEL
Anion gap: 9 (ref 5–15)
BUN: 11 mg/dL (ref 6–20)
CO2: 22 mmol/L (ref 22–32)
Calcium: 8.6 mg/dL — ABNORMAL LOW (ref 8.9–10.3)
Chloride: 104 mmol/L (ref 98–111)
Creatinine, Ser: 1.04 mg/dL (ref 0.61–1.24)
GFR, Estimated: >60 mL/min (ref >60)
Glucose, Bld: 118 mg/dL — ABNORMAL HIGH (ref 70–99)
Potassium: 3.8 mmol/L (ref 3.5–5.1)
Sodium: 135 mmol/L (ref 135–145)

## 2021-05-16 LAB — CBC
HCT: 34.9 % — ABNORMAL LOW (ref 39.0–52.0)
Hemoglobin: 11.8 g/dL — ABNORMAL LOW (ref 13.0–17.0)
MCH: 26.5 pg (ref 26.0–34.0)
MCHC: 33.8 g/dL (ref 30.0–36.0)
MCV: 78.4 fL — ABNORMAL LOW (ref 80.0–100.0)
Platelets: 258 10*3/uL (ref 150–400)
RBC: 4.45 MIL/uL (ref 4.22–5.81)
RDW: 13.4 % (ref 11.5–15.5)
WBC: 17.6 10*3/uL — ABNORMAL HIGH (ref 4.0–10.5)
nRBC: 0 % (ref 0.0–0.2)

## 2021-05-16 LAB — HIV-1/2 AB - DIFFERENTIATION
HIV 1 Ab: REACTIVE — AB
HIV 2 Ab: NONREACTIVE

## 2021-05-16 LAB — POCT ACTIVATED CLOTTING TIME: Activated Clotting Time: 499 seconds

## 2021-05-16 MED ORDER — CHLORHEXIDINE GLUCONATE CLOTH 2 % EX PADS
6.0000 | MEDICATED_PAD | Freq: Every day | CUTANEOUS | Status: DC
  Administered 2021-05-16 – 2021-05-17 (×2): 6 via TOPICAL

## 2021-05-16 MED ORDER — ALPRAZOLAM 0.25 MG PO TABS
0.2500 mg | ORAL_TABLET | Freq: Three times a day (TID) | ORAL | Status: DC | PRN
  Administered 2021-05-16 – 2021-05-17 (×2): 0.25 mg via ORAL
  Filled 2021-05-16 (×2): qty 1

## 2021-05-16 MED ORDER — ALPRAZOLAM 0.5 MG PO TABS
0.5000 mg | ORAL_TABLET | Freq: Once | ORAL | Status: AC
  Administered 2021-05-16: 0.5 mg via ORAL
  Filled 2021-05-16: qty 1

## 2021-05-16 NOTE — Progress Notes (Signed)
CARDIAC REHAB PHASE I   PRE:  Rate/Rhythm: 100 ST  BP:  Sitting: 107/82      SaO2: 100 2L --> 98 RA  MODE:  Ambulation: 370 ft   POST:  Rate/Rhythm: 107 ST  BP:  Sitting: 100/64    SaO2: 98 RA   Pt ambulated 375ft in hallway standby assist with slow steady gait. Pt took 2 standing rest breaks c/o SOB. Sats maintained on RA. Pt coached through purse lipped breathing, given a cola. Encouraged pt to take Brilinta with caffeine to help with SOB. Pt returned to bed. Educated on importance of ASA, Brilinta, statin, and NTG. Pt given MI book, stent card, heart healthy diet, and smoking cessation tip sheet. Reviewed site care, restrictions, and exercise guidelines. Will refer to CRP II [place].   [phone] Reynold Bowen, RN BSN 05/16/2021 10:49 AM

## 2021-05-16 NOTE — TOC Benefit Eligibility Note (Signed)
Patient Product/process development scientist completed.    The patient is currently admitted and upon discharge could be taking Brilinta 90 mg.  The current 30 day co-pay is, $30.00.   The patient is insured through [employer]   Roland Earl, CPhT Pharmacy Patient Advocate Specialist [facility] Antimicrobial Stewardship Team Direct Number: [phone]  Fax: [phone]

## 2021-05-16 NOTE — Progress Notes (Signed)
Progress Note  Patient Name: Christopher Mayo Date of Encounter: 05/16/2021  [facility] Cardiologist: Dr. Peter Swaziland  Subjective   Postop day 1 lateral STEMI treated with PCI and stenting of an occluded proximal circumflex.  His troponins were low and his EF was normal by 2D echo.  He is pain-free.  His radial puncture site is stable.  He is hemodynamically stable.  Plan transfer to telemetry  Inpatient Medications    Scheduled Meds: . aspirin EC  81 mg Oral Daily  . atorvastatin  80 mg Oral Daily  . bictegravir-emtricitabine-tenofovir AF  1 tablet Oral Daily  . enoxaparin (LOVENOX) injection  40 mg Subcutaneous Q24H  . metoprolol tartrate  12.5 mg Oral BID  . sodium chloride flush  3 mL Intravenous Q12H  . ticagrelor  90 mg Oral BID   Continuous Infusions: . sodium chloride     PRN Meds: sodium chloride, acetaminophen, ALPRAZolam, ipratropium-albuterol, morphine injection, ondansetron (ZOFRAN) IV, sodium chloride flush   Vital Signs    Vitals:   05/16/21 0400 05/16/21 0600 05/16/21 0742 05/16/21 0800  BP: 122/75 127/76  102/66  Pulse: (!) 103 (!) 111  (!) 107  Resp: (!) 22 (!) 37  (!) 33  Temp: 98.6 F (37 C)  98.8 F (37.1 C)   TempSrc: Oral  Oral   SpO2: 97% 99%  97%  Weight:  60.4 kg    Height:        Intake/Output Summary (Last 24 hours) at 05/16/2021 0904 Last data filed at 05/16/2021 0600 Gross per 24 hour  Intake 485.22 ml  Output 1050 ml  Net -564.78 ml   Last 3 Weights 05/16/2021 05/15/2021  Weight (lbs) 133 lb 2.5 oz 150 lb  Weight (kg) 60.4 kg 68.04 kg      Telemetry    Sinus rhythm- Personally Reviewed  ECG    Sinus tachycardia at 115 with persistent lateral mild ST segment elevation and incomplete right bundle branch block.- Personally Reviewed  Physical Exam   GEN: No acute distress.   Neck: No JVD Cardiac: RRR, no murmurs, rubs, or gallops.  Respiratory: Clear to auscultation bilaterally. GI: Soft, nontender,  non-distended  MS: No edema; No deformity. Neuro:  Nonfocal  Psych: Normal affect  Extremities: Right radial puncture intact  Labs    High Sensitivity Troponin:   Recent Labs  Lab 05/15/21 0038 05/15/21 0216  TROPONINIHS 3,730* 3,110*      Chemistry Recent Labs  Lab 05/15/21 0038 05/16/21 0308  NA 139 135  K 3.9 3.8  CL 106 104  CO2 22 22  GLUCOSE 104* 118*  BUN 12 11  CREATININE 0.93 1.04  CALCIUM 9.1 8.6*  GFRNONAA >60 >60  ANIONGAP 11 9     Hematology Recent Labs  Lab 05/15/21 0038 05/15/21 1503 05/16/21 0308  WBC 8.7 18.2* 17.6*  RBC 5.31 4.52 4.45  HGB 13.9 11.7* 11.8*  HCT 42.4 35.9* 34.9*  MCV 79.8* 79.4* 78.4*  MCH 26.2 25.9* 26.5  MCHC 32.8 32.6 33.8  RDW 14.1 13.6 13.4  PLT 254 241 258    BNPNo results for input(s): BNP, PROBNP in the last 168 hours.   DDimer  Recent Labs  Lab 05/15/21 0038  DDIMER 0.57*     Radiology    DG Chest 2 View  Result Date: 05/15/2021 CLINICAL DATA:  Chest pain radiating to the left shoulder. EXAM: CHEST - 2 VIEW COMPARISON:  None. FINDINGS: Low lung volumes are seen with mild areas of atelectasis  noted within the bilateral lung bases. There is no evidence of a pleural effusion or pneumothorax. The heart size and mediastinal contours are within normal limits. The visualized skeletal structures are unremarkable. IMPRESSION: Low lung volumes with mild bibasilar atelectasis. Electronically Signed   By: Aram Candelahaddeus  Houston M.D.   On: 05/15/2021 00:51   CARDIAC CATHETERIZATION  Result Date: 05/15/2021  Prox RCA to Mid RCA lesion is 25% stenosed.  Prox Cx lesion is 100% stenosed.  A drug-eluting stent was successfully placed using a SYNERGY XD 3.50X20.  Post intervention, there is a 0% residual stenosis.  LV end diastolic pressure is mildly elevated.  1. Single vessel occlusive CAD involving the proximal LCx 2. Mildly elevated LVEDP 3. Successful PCI of the proximal LCx with DES x 1 Plan: DAPT for one year. Assess LV  function by Echo.   DG CHEST PORT 1 VIEW  Result Date: 05/15/2021 CLINICAL DATA:  Dyspnea EXAM: PORTABLE CHEST 1 VIEW COMPARISON:  05/15/2021 FINDINGS: Lung volumes are small and there is bibasilar atelectasis. Focal opacity within the left mid lung zone is artifactual related to overlying tubing. Bibasilar atelectasis is present. No pneumothorax or pleural effusion. Cardiac size is within normal limits when accounting for changes in patient positioning. The pulmonary vascularity is normal. IMPRESSION: Pulmonary hypoinflation Electronically Signed   By: Helyn NumbersAshesh  Parikh MD   On: 05/15/2021 19:59   ECHOCARDIOGRAM COMPLETE  Result Date: 05/15/2021    ECHOCARDIOGRAM REPORT   Patient Name:   Christopher Mayo Date of Exam: 05/15/2021 Medical Rec #:  [id number]             Height:       64.0 in Accession #:    [id number]            Weight:       150.0 lb Date of Birth:  08/18/1971              BSA:          1.731 m Patient Age:    49 years              BP:           102/70 mmHg Patient Gender: M                     HR:           79 bpm. Exam Location:  Inpatient Procedure: 2D Echo Indications:    NSTEMI  History:        Patient has no prior history of Echocardiogram examinations.                 HIV; Risk Factors:Current Smoker.  Sonographer:    Shirlean KellyJohn Mendel Brown Referring Phys: [phone] PETER M SwazilandJORDAN IMPRESSIONS  1. Left ventricular ejection fraction, by estimation, is 55 to 60%. The left ventricle has normal function. The left ventricle demonstrates regional wall motion abnormalities (see scoring diagram/findings for description). Left ventricular diastolic parameters are indeterminate.  2. Right ventricular systolic function is normal. The right ventricular size is normal.  3. The mitral valve is normal in structure. Mild mitral valve regurgitation. No evidence of mitral stenosis.  4. The aortic valve is tricuspid. Aortic valve regurgitation is moderate. No aortic stenosis is present. FINDINGS  Left Ventricle:  Left ventricular ejection fraction, by estimation, is 55 to 60%. The left ventricle has normal function. The left ventricle demonstrates regional wall motion abnormalities. The left ventricular internal cavity size was normal in  size. There is no left ventricular hypertrophy. Left ventricular diastolic parameters are indeterminate.  LV Wall Scoring: The posterior wall is hypokinetic. Right Ventricle: The right ventricular size is normal. No increase in right ventricular wall thickness. Right ventricular systolic function is normal. Left Atrium: Left atrial size was normal in size. Right Atrium: Right atrial size was normal in size. Pericardium: There is no evidence of pericardial effusion. Mitral Valve: The mitral valve is normal in structure. Mild mitral valve regurgitation. No evidence of mitral valve stenosis. Tricuspid Valve: The tricuspid valve is normal in structure. Tricuspid valve regurgitation is not demonstrated. No evidence of tricuspid stenosis. Aortic Valve: The aortic valve is tricuspid. Aortic valve regurgitation is moderate. Aortic regurgitation PHT measures 449 msec. No aortic stenosis is present. Aortic valve mean gradient measures 6.0 mmHg. Aortic valve peak gradient measures 10.6 mmHg. Aortic valve area, by VTI measures 1.82 cm. Pulmonic Valve: The pulmonic valve was not well visualized. Pulmonic valve regurgitation is not visualized. No evidence of pulmonic stenosis. Aorta: The aortic root is normal in size and structure. Pulmonary Artery: Indeterminant PASP, inadequate TR jet. IAS/Shunts: No atrial level shunt detected by color flow Doppler.  LEFT VENTRICLE PLAX 2D LVIDd:         4.70 cm  Diastology LVIDs:         3.30 cm  LV e' medial:    6.96 cm/s LV PW:         1.00 cm  LV E/e' medial:  12.7 LV IVS:        0.80 cm  LV e' lateral:   6.42 cm/s LVOT diam:     1.90 cm  LV E/e' lateral: 13.8 LV SV:         49 LV SV Index:   28 LVOT Area:     2.84 cm  RIGHT VENTRICLE             IVC RV Basal  diam:  3.50 cm     IVC diam: 1.00 cm RV S prime:     12.70 cm/s TAPSE (M-mode): 1.7 cm LEFT ATRIUM             Index       RIGHT ATRIUM           Index LA diam:        2.80 cm 1.62 cm/m  RA Area:     12.00 cm LA Vol (A2C):   32.1 ml 18.54 ml/m RA Volume:   29.50 ml  17.04 ml/m LA Vol (A4C):   31.2 ml 18.02 ml/m LA Biplane Vol: 31.9 ml 18.43 ml/m  AORTIC VALVE AV Area (Vmax):    1.68 cm AV Area (Vmean):   1.61 cm AV Area (VTI):     1.82 cm AV Vmax:           163.00 cm/s AV Vmean:          111.000 cm/s AV VTI:            0.271 m AV Peak Grad:      10.6 mmHg AV Mean Grad:      6.0 mmHg LVOT Vmax:         96.30 cm/s LVOT Vmean:        62.900 cm/s LVOT VTI:          0.174 m LVOT/AV VTI ratio: 0.64 AI PHT:            449 msec  AORTA Ao Root diam: 2.75 cm Ao Asc  diam:  2.60 cm MITRAL VALVE MV Area (PHT): 5.13 cm    SHUNTS MV Decel Time: 148 msec    Systemic VTI:  0.17 m MV E velocity: 88.60 cm/s  Systemic Diam: 1.90 cm MV A velocity: 74.40 cm/s MV E/A ratio:  1.19 Dina Rich MD Electronically signed by Dina Rich MD Signature Date/Time: 05/15/2021/4:47:11 PM    Final     Cardiac Studies   2D echocardiogram (12/15/2021)  IMPRESSIONS    1. Left ventricular ejection fraction, by estimation, is 55 to 60%. The  left ventricle has normal function. The left ventricle demonstrates  regional wall motion abnormalities (see scoring diagram/findings for  description). Left ventricular diastolic  parameters are indeterminate.  2. Right ventricular systolic function is normal. The right ventricular  size is normal.  3. The mitral valve is normal in structure. Mild mitral valve  regurgitation. No evidence of mitral stenosis.  4. The aortic valve is tricuspid. Aortic valve regurgitation is moderate.  No aortic stenosis is present.   Cardiac catheterization/PCI stent (05/15/2021)  Conclusion    Prox RCA to Mid RCA lesion is 25% stenosed.  Prox Cx lesion is 100% stenosed.  A  drug-eluting stent was successfully placed using a SYNERGY XD 3.50X20.  Post intervention, there is a 0% residual stenosis.  LV end diastolic pressure is mildly elevated.   1. Single vessel occlusive CAD involving the proximal LCx 2. Mildly elevated LVEDP 3. Successful PCI of the proximal LCx with DES x 1  Plan: DAPT for one year. Assess LV function by Echo.   Coronary Diagrams   Diagnostic Dominance: Right    Intervention     Implants       Patient Profile     50 y.o. male African-American male who is HIV positive who presented 2 days ago with non-STEMI.  His pain progressed and his EKG showed lateral ST segment elevation myocardial infarction.  He was taken to the Cath Lab by Dr. Swaziland revealing occluded proximal circumflex.  Drug-eluting stent was placed.  His troponins went up to 3000.  His EF is normal by 2D echo.  He is pain-free.  Assessment & Plan    1: Lateral STEMI- postop day 1 lateral STEMI.  Status post PCI drug-eluting stenting of the proximal circumflex by Dr. Swaziland through the right radial approach.  He had excellent angiographic result.  His EF is preserved.  Troponins only 1 of 3000.  He is on DAPT with aspirin and Brilinta.  We will slowly add beta-blocker.  His LDL is low.  He will need uninterrupted DAPT for at least 12 months.  Transfer to telemetry, cardiac rehab, ambulate, titrate beta-blocker as tolerated, probably discharge home tomorrow.  For questions or updates, please contact [facility] Please consult [email or web address] for contact info under        Signed, Nanetta Batty, MD  05/16/2021, 9:04 AM

## 2021-05-17 ENCOUNTER — Other Ambulatory Visit (HOSPITAL_COMMUNITY): Payer: Self-pay

## 2021-05-17 DIAGNOSIS — Z720 Tobacco use: Secondary | ICD-10-CM

## 2021-05-17 DIAGNOSIS — E781 Pure hyperglyceridemia: Secondary | ICD-10-CM

## 2021-05-17 DIAGNOSIS — I2129 ST elevation (STEMI) myocardial infarction involving other sites: Secondary | ICD-10-CM

## 2021-05-17 DIAGNOSIS — I25110 Atherosclerotic heart disease of native coronary artery with unstable angina pectoris: Secondary | ICD-10-CM | POA: Diagnosis present

## 2021-05-17 DIAGNOSIS — R0602 Shortness of breath: Secondary | ICD-10-CM

## 2021-05-17 DIAGNOSIS — B20 Human immunodeficiency virus [HIV] disease: Secondary | ICD-10-CM

## 2021-05-17 DIAGNOSIS — Z955 Presence of coronary angioplasty implant and graft: Secondary | ICD-10-CM

## 2021-05-17 LAB — CBC
HCT: 35.4 % — ABNORMAL LOW (ref 39.0–52.0)
Hemoglobin: 12 g/dL — ABNORMAL LOW (ref 13.0–17.0)
MCH: 26.4 pg (ref 26.0–34.0)
MCHC: 33.9 g/dL (ref 30.0–36.0)
MCV: 78 fL — ABNORMAL LOW (ref 80.0–100.0)
Platelets: 277 10*3/uL (ref 150–400)
RBC: 4.54 MIL/uL (ref 4.22–5.81)
RDW: 13.2 % (ref 11.5–15.5)
WBC: 11.7 10*3/uL — ABNORMAL HIGH (ref 4.0–10.5)
nRBC: 0 % (ref 0.0–0.2)

## 2021-05-17 MED ORDER — NITROGLYCERIN 0.4 MG SL SUBL
0.4000 mg | SUBLINGUAL_TABLET | SUBLINGUAL | 2 refills | Status: AC | PRN
  Filled 2021-05-17: qty 25, 8d supply, fill #0

## 2021-05-17 MED ORDER — TICAGRELOR 90 MG PO TABS
90.0000 mg | ORAL_TABLET | Freq: Two times a day (BID) | ORAL | 2 refills | Status: AC
  Filled 2021-05-17: qty 60, 30d supply, fill #0

## 2021-05-17 MED ORDER — ASPIRIN 81 MG PO TBEC
81.0000 mg | DELAYED_RELEASE_TABLET | Freq: Every day | ORAL | 2 refills | Status: AC
Start: 2021-05-18 — End: ?
  Filled 2021-05-17: qty 30, 30d supply, fill #0

## 2021-05-17 MED ORDER — METOPROLOL TARTRATE 25 MG PO TABS
25.0000 mg | ORAL_TABLET | Freq: Two times a day (BID) | ORAL | Status: DC
  Administered 2021-05-17: 25 mg via ORAL
  Filled 2021-05-17: qty 1

## 2021-05-17 MED ORDER — METOPROLOL TARTRATE 25 MG PO TABS
25.0000 mg | ORAL_TABLET | Freq: Two times a day (BID) | ORAL | 0 refills | Status: AC
  Filled 2021-05-17: qty 60, 30d supply, fill #0

## 2021-05-17 MED ORDER — ATORVASTATIN CALCIUM 80 MG PO TABS
80.0000 mg | ORAL_TABLET | Freq: Every day | ORAL | 1 refills | Status: DC
  Filled 2021-05-17: qty 30, 30d supply, fill #0

## 2021-05-17 NOTE — Progress Notes (Signed)
CARDIAC REHAB PHASE I   PRE:  Rate/Rhythm: 85 SR  BP:  Sitting: 109/76      SaO2: 100 RA  MODE:  Ambulation: 740 ft   POST:  Rate/Rhythm: 96 SR  BP:  Sitting: 112/72    SaO2: 96 RA   Pt ambulated 713ft in hallway, took one short standing rest break c/o SOB. Pt states he feels much better today. Reviewed importance of ASA and Brilinta. Pt denies questions or concerns regarding yesterdays education. Pt requesting note for work, NP made aware. Referred to [facility] [place].  [id number] Reynold Bowen, RN BSN 05/17/2021 11:22 AM

## 2021-05-17 NOTE — Progress Notes (Signed)
Progress Note  Patient Name: Christopher Mayo Date of Encounter: 05/17/2021  [facility] Cardiologist: Peter Swaziland, MD   Subjective   Feeling anxious.  Also having shortness of breath.   Inpatient Medications    Scheduled Meds: . aspirin EC  81 mg Oral Daily  . atorvastatin  80 mg Oral Daily  . bictegravir-emtricitabine-tenofovir AF  1 tablet Oral Daily  . Chlorhexidine Gluconate Cloth  6 each Topical Daily  . enoxaparin (LOVENOX) injection  40 mg Subcutaneous Q24H  . metoprolol tartrate  25 mg Oral BID  . sodium chloride flush  3 mL Intravenous Q12H  . ticagrelor  90 mg Oral BID   Continuous Infusions: . sodium chloride     PRN Meds: sodium chloride, acetaminophen, ALPRAZolam, ipratropium-albuterol, morphine injection, ondansetron (ZOFRAN) IV, sodium chloride flush   Vital Signs    Vitals:   05/17/21 0500 05/17/21 0600 05/17/21 0700 05/17/21 0737  BP: 113/90 103/60 102/79   Pulse: 100 99 85   Resp: (!) 26 (!) 29 (!) 23   Temp:    98.1 F (36.7 C)  TempSrc:    Oral  SpO2: 100% 96% 97%   Weight:      Height:        Intake/Output Summary (Last 24 hours) at 05/17/2021 0847 Last data filed at 05/17/2021 0600 Gross per 24 hour  Intake 240 ml  Output 850 ml  Net -610 ml   Last 3 Weights 05/16/2021 05/15/2021  Weight (lbs) 133 lb 2.5 oz 150 lb  Weight (kg) 60.4 kg 68.04 kg      Telemetry    Sinus rhythm or sinus tachycardia rates in the high 90s to low 100s.- Personally Reviewed  ECG    No new EKGs- Personally Reviewed  Physical Exam  Somewhat anxious GEN: No acute distress.   Neck: No JVD Cardiac: Tachycardic,RRR, no murmurs, rubs, or gallops.  Respiratory: Clear to auscultation bilaterally.  Nonlabored, good air movement GI: Soft, nontender, non-distended  MS: No edema; No deformity. Neuro:  Nonfocal  Psych: Normal affect   Labs    High Sensitivity Troponin:   Recent Labs  Lab 05/15/21 0038 05/15/21 0216  TROPONINIHS 3,730* 3,110*       Chemistry Recent Labs  Lab 05/15/21 0038 05/16/21 0308  NA 139 135  K 3.9 3.8  CL 106 104  CO2 22 22  GLUCOSE 104* 118*  BUN 12 11  CREATININE 0.93 1.04  CALCIUM 9.1 8.6*  GFRNONAA >60 >60  ANIONGAP 11 9     Hematology Recent Labs  Lab 05/15/21 1503 05/16/21 0308 05/17/21 0454  WBC 18.2* 17.6* 11.7*  RBC 4.52 4.45 4.54  HGB 11.7* 11.8* 12.0*  HCT 35.9* 34.9* 35.4*  MCV 79.4* 78.4* 78.0*  MCH 25.9* 26.5 26.4  MCHC 32.6 33.8 33.9  RDW 13.6 13.4 13.2  PLT 241 258 277    BNPNo results for input(s): BNP, PROBNP in the last 168 hours.   DDimer  Recent Labs  Lab 05/15/21 0038  DDIMER 0.57*     Radiology    DG CHEST PORT 1 VIEW  Result Date: 05/15/2021 CLINICAL DATA:  Dyspnea EXAM: PORTABLE CHEST 1 VIEW COMPARISON:  05/15/2021 FINDINGS: Lung volumes are small and there is bibasilar atelectasis. Focal opacity within the left mid lung zone is artifactual related to overlying tubing. Bibasilar atelectasis is present. No pneumothorax or pleural effusion. Cardiac size is within normal limits when accounting for changes in patient positioning. The pulmonary vascularity is normal. IMPRESSION: Pulmonary hypoinflation  Electronically Signed   By: Helyn Numbers MD   On: 05/15/2021 19:59   ECHOCARDIOGRAM COMPLETE  Result Date: 05/15/2021    ECHOCARDIOGRAM REPORT   Patient Name:   Christopher Mayo [facility] Date of Exam: 05/15/2021 Medical Rec #:  [id number]             Height:       64.0 in Accession #:    [id number]            Weight:       150.0 lb Date of Birth:  14-Jul-1971              BSA:          1.731 m Patient Age:    49 years              BP:           102/70 mmHg Patient Gender: M                     HR:           79 bpm. Exam Location:  Inpatient Procedure: 2D Echo Indications:    NSTEMI  History:        Patient has no prior history of Echocardiogram examinations.                 HIV; Risk Factors:Current Smoker.  Sonographer:    Shirlean Kelly Referring Phys: [phone]  PETER M Swaziland IMPRESSIONS  1. Left ventricular ejection fraction, by estimation, is 55 to 60%. The left ventricle has normal function. The left ventricle demonstrates regional wall motion abnormalities (see scoring diagram/findings for description). Left ventricular diastolic parameters are indeterminate.  2. Right ventricular systolic function is normal. The right ventricular size is normal.  3. The mitral valve is normal in structure. Mild mitral valve regurgitation. No evidence of mitral stenosis.  4. The aortic valve is tricuspid. Aortic valve regurgitation is moderate. No aortic stenosis is present. FINDINGS  Left Ventricle: Left ventricular ejection fraction, by estimation, is 55 to 60%. The left ventricle has normal function. The left ventricle demonstrates regional wall motion abnormalities. The left ventricular internal cavity size was normal in size. There is no left ventricular hypertrophy. Left ventricular diastolic parameters are indeterminate.  LV Wall Scoring: The posterior wall is hypokinetic. Right Ventricle: The right ventricular size is normal. No increase in right ventricular wall thickness. Right ventricular systolic function is normal. Left Atrium: Left atrial size was normal in size. Right Atrium: Right atrial size was normal in size. Pericardium: There is no evidence of pericardial effusion. Mitral Valve: The mitral valve is normal in structure. Mild mitral valve regurgitation. No evidence of mitral valve stenosis. Tricuspid Valve: The tricuspid valve is normal in structure. Tricuspid valve regurgitation is not demonstrated. No evidence of tricuspid stenosis. Aortic Valve: The aortic valve is tricuspid. Aortic valve regurgitation is moderate. Aortic regurgitation PHT measures 449 msec. No aortic stenosis is present. Aortic valve mean gradient measures 6.0 mmHg. Aortic valve peak gradient measures 10.6 mmHg. Aortic valve area, by VTI measures 1.82 cm. Pulmonic Valve: The pulmonic valve was  not well visualized. Pulmonic valve regurgitation is not visualized. No evidence of pulmonic stenosis. Aorta: The aortic root is normal in size and structure. Pulmonary Artery: Indeterminant PASP, inadequate TR jet. IAS/Shunts: No atrial level shunt detected by color flow Doppler.  LEFT VENTRICLE PLAX 2D LVIDd:         4.70 cm  Diastology LVIDs:  3.30 cm  LV e' medial:    6.96 cm/s LV PW:         1.00 cm  LV E/e' medial:  12.7 LV IVS:        0.80 cm  LV e' lateral:   6.42 cm/s LVOT diam:     1.90 cm  LV E/e' lateral: 13.8 LV SV:         49 LV SV Index:   28 LVOT Area:     2.84 cm  RIGHT VENTRICLE             IVC RV Basal diam:  3.50 cm     IVC diam: 1.00 cm RV S prime:     12.70 cm/s TAPSE (M-mode): 1.7 cm LEFT ATRIUM             Index       RIGHT ATRIUM           Index LA diam:        2.80 cm 1.62 cm/m  RA Area:     12.00 cm LA Vol (A2C):   32.1 ml 18.54 ml/m RA Volume:   29.50 ml  17.04 ml/m LA Vol (A4C):   31.2 ml 18.02 ml/m LA Biplane Vol: 31.9 ml 18.43 ml/m  AORTIC VALVE AV Area (Vmax):    1.68 cm AV Area (Vmean):   1.61 cm AV Area (VTI):     1.82 cm AV Vmax:           163.00 cm/s AV Vmean:          111.000 cm/s AV VTI:            0.271 m AV Peak Grad:      10.6 mmHg AV Mean Grad:      6.0 mmHg LVOT Vmax:         96.30 cm/s LVOT Vmean:        62.900 cm/s LVOT VTI:          0.174 m LVOT/AV VTI ratio: 0.64 AI PHT:            449 msec  AORTA Ao Root diam: 2.75 cm Ao Asc diam:  2.60 cm MITRAL VALVE MV Area (PHT): 5.13 cm    SHUNTS MV Decel Time: 148 msec    Systemic VTI:  0.17 m MV E velocity: 88.60 cm/s  Systemic Diam: 1.90 cm MV A velocity: 74.40 cm/s MV E/A ratio:  1.19 Dina RichJonathan Branch MD Electronically signed by Dina RichJonathan Branch MD Signature Date/Time: 05/15/2021/4:47:11 PM    Final     Cardiac Studies    Cardiac Cath 05/15/2021: Prox LCx 100% -> DES PCI Synergy 3.5 x 20, Mildly elevated LVEDP.   Echo 05/15/2021: EF 55-60%. Posterior HK. Mod AI. No AS.   Patient Profile     50 y.o.  male with controlled HIV who presented with ACS- initially presented as NSTEMI, but overnight progressed to Lateral STEMI - taken for Urgent Cath-PCI of LCx.    Assessment & Plan    Principal Problem:   Acute ST elevation myocardial infarction (STEMI) of lateral wall (HCC)    Coronary artery disease involving native coronary artery of native heart with unstable angina pectoris (HCC)   Presence of drug coated stent in left circumflex coronary artery   Sinus Tachycardia  On DAPT - ASA/ Brilinta --> SOB Sx noted, discussed taking with caffiene  Low dose BB tolerated, but with HR still in 100s - (partially related to underlying anxiety), will increase  Lopressor to 25 mg BID.    BP reamains in low 100s-120s, will titrate BB 1st --> Consider ARB (over ACE-I to avoid confusion with SOB Sx) as OP     HIV (human immunodeficiency virus infection) (HCC) - continue Biktarvy   Hypertriglyceridemia / Hyperlipidemia - Atorvastatin 80 mg   Tobacco abuse -- smoking cessation counseling. (Also discussed concern re: Marijuana use - uses for appetite enhancement, likely related to HIV meds.)   Has done well with [facility].   Will plan to ambulate today & if stable after AM dose of BB, will plan d/c today.  OP f/u with Dr. Swaziland / APP Team.   For questions or updates, please contact [facility] Please consult [email or web address] for contact info under        Signed, Bryan Lemma, MD  05/17/2021, 8:47 AM

## 2021-05-17 NOTE — Discharge Summary (Addendum)
Discharge Summary    Patient ID: Christopher Mayo MRN: [id number]; DOB: Jan 23, 1971  Admit date: 05/15/2021 Discharge date: 05/17/2021  PCP:  Christopher Mayer, MD   [facility] Providers Cardiologist:  Christopher Martinique, MD   {  Discharge Diagnoses    Principal Problem:   Acute ST elevation myocardial infarction (STEMI) of lateral wall [facility]) Active Problems:   NSTEMI (non-ST elevated myocardial infarction) ([place])   HIV (human immunodeficiency virus infection) ([place])   Hypertriglyceridemia   Tobacco abuse   Coronary artery disease involving native coronary artery of native heart with unstable angina pectoris ([place])   Presence of drug coated stent in left circumflex coronary artery   Diagnostic Studies/Procedures    Cath: 05/15/21   Prox RCA to Mid RCA lesion is 25% stenosed.  Prox Cx lesion is 100% stenosed.  A drug-eluting stent was successfully placed using a SYNERGY XD 3.50X20.  Post intervention, there is a 0% residual stenosis.  LV end diastolic pressure is mildly elevated.   1. Single vessel occlusive CAD involving the proximal LCx 2. Mildly elevated LVEDP 3. Successful PCI of the proximal LCx with DES x 1  Plan: DAPT for one year. Assess LV function by Echo.   Diagnostic Dominance: Right    Intervention    Echo: 05/15/21  IMPRESSIONS    1. Left ventricular ejection fraction, by estimation, is 55 to 60%. The  left ventricle has normal function. The left ventricle demonstrates  regional wall motion abnormalities (see scoring diagram/findings for  description). Left ventricular diastolic  parameters are indeterminate.  2. Right ventricular systolic function is normal. The right ventricular  size is normal.  3. The mitral valve is normal in structure. Mild mitral valve  regurgitation. No evidence of mitral stenosis.  4. The aortic valve is tricuspid. Aortic valve regurgitation is moderate.  No aortic stenosis is present.   FINDINGS  Left  Ventricle: Left ventricular ejection fraction, by estimation, is 55  to 60%. The left ventricle has normal function. The left ventricle  demonstrates regional wall motion abnormalities. The left ventricular  internal cavity size was normal in size.  There is no left ventricular hypertrophy. Left ventricular diastolic  parameters are indeterminate.  _____________   History of Present Illness     Christopher Mayo is a 50 y.o. male with PMH of HIV who presented with chest pain and admitted with NSTEMI. [name] reported that on 05/21 he developed acute onset left upper neck pain which quickly migrated to his substernal center of the chest with 9/10 severity.  He had never experienced anything like this before and had no prior history of CAD or other comorbidities besides HIV which he was diagnosed with in his 95s and has been well controlled.  He works at an [employer] and is fairly active.  He does smoke several cigarettes per day (max 1 ppd for short periods of time, currently 2-3 cigarettes daily) most of his life along with occasional marijuana use but denied any other nonprescription drugs including cocaine.  His chest discomfort had been persistent since its onset.  He denied any family history of CAD or premature sudden cardiac death.  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  Sometimes the job is physically demanding although the day prior to admission he said that he was working late and that was less strenuous  than his typical day.  He denied any other risk factors for CAD such as HTN, HLD, or DM2.  He had borderline ST elevations in his lateral leads and although neither met criteria he also had subtle elevations in V1 and 1 mm in V2 that were not present on admission ECGs.  His troponin peaked (3730) and was down trending (3110). Plan was to admit  for cardiac cath. Developed recurrent chest pain and EKG while in the ED showed evolving ST elevation in anterolateral leads. Code STEMI was called and taken to the cath lab.   Hospital Course     1. Acute STEMI: underwent PCI/DESx 1 to pLCx. Placed on DAPT with ASA/Brilinta for at least one year. hsTn peaked at 3730.  No recurrent chest pain. Follow up echo showed EF of 55-60% with hypokinesis in the posterior wall. Worked well with cardiac rehab. -- Did have some shortness of breath but felt this was likely related to Brilinta.   2. Sinus Tachycardia: Low dose BB tolerated which was increased to Lopressor to 25 mg BID.  -- Consider ARB (over ACE-I to avoid confusion with SOB Sx) as OP per Dr. Ellyn Hack   3. HIV: continue Biktarvy  4. Hypertriglyceridemia / Hyperlipidemia: started on atorvastatin 80 mg -- LDL 61  5. Tobacco abuse: smoking cessation counseling  Patient was seen by Dr. Ellyn Hack and deemed stable for discharge home. Follow up in the office has been arranged. Medications were sent to [facility] prior to discharge. Educated by pharmD.   Did the patient have an acute coronary syndrome (MI, NSTEMI, STEMI, etc) this admission?:  Yes                               AHA/ACC Clinical Performance & Quality Measures: 1. Aspirin prescribed? - Yes 2. ADP Receptor Inhibitor (Plavix/Clopidogrel, Brilinta/Ticagrelor or Effient/Prasugrel) prescribed (includes medically managed patients)? - Yes 3. Beta Blocker prescribed? - Yes 4. High Intensity Statin (Lipitor 40-34m or Crestor 20-41m prescribed? - Yes 5. EF assessed during THIS hospitalization? - Yes 6. For EF <40%, was ACEI/ARB prescribed? - Not Applicable (EF >/= 4011%7. For EF <40%, Aldosterone Antagonist (Spironolactone or Eplerenone) prescribed? - Not Applicable (EF >/= 4094%8. Cardiac Rehab Phase II ordered (including medically managed patients)? - Yes       _____________  Discharge Vitals Blood pressure 109/76, pulse 88,  temperature 97.8 F (36.6 C), temperature source Oral, resp. rate (!) 21, height '5\' 4"'  (1.626 m), weight 60.4 kg, SpO2 97 %.  Filed Weights   05/15/21 0017 05/16/21 0600  Weight: 68 kg 60.4 kg    Labs & Radiologic Studies    CBC Recent Labs    05/16/21 0308 05/17/21 0454  WBC 17.6* 11.7*  HGB 11.8* 12.0*  HCT 34.9* 35.4*  MCV 78.4* 78.0*  PLT 258 27174 Basic Metabolic Panel Recent Labs    05/15/21 0038 05/16/21 0308  NA 139 135  K 3.9 3.8  CL 106 104  CO2 22 22  GLUCOSE 104* 118*  BUN 12 11  CREATININE 0.93 1.04  CALCIUM 9.1 8.6*   Liver Function Tests No results for input(s): AST, ALT, ALKPHOS, BILITOT, PROT, ALBUMIN in the last 72 hours. No results for input(s): LIPASE, AMYLASE in the last 72 hours. High Sensitivity Troponin:   Recent Labs  Lab 05/15/21 0038 05/15/21 0216  TROPONINIHS 3,730* 3,110*    BNP Invalid input(s): POCBNP D-Dimer Recent Labs  05/15/21 0038  DDIMER 0.57*   Hemoglobin A1C Recent Labs    05/15/21 0705  HGBA1C 6.3*   Fasting Lipid Panel Recent Labs    05/15/21 0705  CHOL 112  HDL 44  LDLCALC 61  TRIG 33  CHOLHDL 2.5   Thyroid Function Tests Recent Labs    05/15/21 0705  TSH 1.049   _____________  DG Chest 2 View  Result Date: 05/15/2021 CLINICAL DATA:  Chest pain radiating to the left shoulder. EXAM: CHEST - 2 VIEW COMPARISON:  None. FINDINGS: Low lung volumes are seen with mild areas of atelectasis noted within the bilateral lung bases. There is no evidence of a pleural effusion or pneumothorax. The heart size and mediastinal contours are within normal limits. The visualized skeletal structures are unremarkable. IMPRESSION: Low lung volumes with mild bibasilar atelectasis. Electronically Signed   By: Virgina Norfolk M.D.   On: 05/15/2021 00:51   CARDIAC CATHETERIZATION  Result Date: 05/15/2021  Prox RCA to Mid RCA lesion is 25% stenosed.  Prox Cx lesion is 100% stenosed.  A drug-eluting stent was  successfully placed using a SYNERGY XD 3.50X20.  Post intervention, there is a 0% residual stenosis.  LV end diastolic pressure is mildly elevated.  1. Single vessel occlusive CAD involving the proximal LCx 2. Mildly elevated LVEDP 3. Successful PCI of the proximal LCx with DES x 1 Plan: DAPT for one year. Assess LV function by Echo.   DG CHEST PORT 1 VIEW  Result Date: 05/15/2021 CLINICAL DATA:  Dyspnea EXAM: PORTABLE CHEST 1 VIEW COMPARISON:  05/15/2021 FINDINGS: Lung volumes are small and there is bibasilar atelectasis. Focal opacity within the left mid lung zone is artifactual related to overlying tubing. Bibasilar atelectasis is present. No pneumothorax or pleural effusion. Cardiac size is within normal limits when accounting for changes in patient positioning. The pulmonary vascularity is normal. IMPRESSION: Pulmonary hypoinflation Electronically Signed   By: Fidela Salisbury MD   On: 05/15/2021 19:59   ECHOCARDIOGRAM COMPLETE  Result Date: 05/15/2021    ECHOCARDIOGRAM REPORT   Patient Name:   [facility] Date of Exam: 05/15/2021 Medical Rec #:  [id number]             Height:       64.0 in Accession #:    [id number]            Weight:       150.0 lb Date of Birth:  [birth year]              BSA:          1.731 m Patient Age:    37 years              BP:           102/70 mmHg Patient Gender: M                     HR:           79 bpm. Exam Location:  Inpatient Procedure: 2D Echo Indications:    NSTEMI  History:        Patient has no prior history of Echocardiogram examinations.                 HIV; Risk Factors:Current Smoker.  Sonographer:    Cammy Brochure Referring Phys: [id number] Christopher M Mayo IMPRESSIONS  1. Left ventricular ejection fraction, by estimation, is 55 to 60%. The left ventricle has normal function. The left ventricle demonstrates  regional wall motion abnormalities (see scoring diagram/findings for description). Left ventricular diastolic parameters are indeterminate.  2. Right  ventricular systolic function is normal. The right ventricular size is normal.  3. The mitral valve is normal in structure. Mild mitral valve regurgitation. No evidence of mitral stenosis.  4. The aortic valve is tricuspid. Aortic valve regurgitation is moderate. No aortic stenosis is present. FINDINGS  Left Ventricle: Left ventricular ejection fraction, by estimation, is 55 to 60%. The left ventricle has normal function. The left ventricle demonstrates regional wall motion abnormalities. The left ventricular internal cavity size was normal in size. There is no left ventricular hypertrophy. Left ventricular diastolic parameters are indeterminate.  LV Wall Scoring: The posterior wall is hypokinetic. Right Ventricle: The right ventricular size is normal. No increase in right ventricular wall thickness. Right ventricular systolic function is normal. Left Atrium: Left atrial size was normal in size. Right Atrium: Right atrial size was normal in size. Pericardium: There is no evidence of pericardial effusion. Mitral Valve: The mitral valve is normal in structure. Mild mitral valve regurgitation. No evidence of mitral valve stenosis. Tricuspid Valve: The tricuspid valve is normal in structure. Tricuspid valve regurgitation is not demonstrated. No evidence of tricuspid stenosis. Aortic Valve: The aortic valve is tricuspid. Aortic valve regurgitation is moderate. Aortic regurgitation PHT measures 449 msec. No aortic stenosis is present. Aortic valve mean gradient measures 6.0 mmHg. Aortic valve peak gradient measures 10.6 mmHg. Aortic valve area, by VTI measures 1.82 cm. Pulmonic Valve: The pulmonic valve was not well visualized. Pulmonic valve regurgitation is not visualized. No evidence of pulmonic stenosis. Aorta: The aortic root is normal in size and structure. Pulmonary Artery: Indeterminant PASP, inadequate TR jet. IAS/Shunts: No atrial level shunt detected by color flow Doppler.  LEFT VENTRICLE PLAX 2D LVIDd:          4.70 cm  Diastology LVIDs:         3.30 cm  LV e' medial:    6.96 cm/s LV PW:         1.00 cm  LV E/e' medial:  12.7 LV IVS:        0.80 cm  LV e' lateral:   6.42 cm/s LVOT diam:     1.90 cm  LV E/e' lateral: 13.8 LV SV:         49 LV SV Index:   28 LVOT Area:     2.84 cm  RIGHT VENTRICLE             IVC RV Basal diam:  3.50 cm     IVC diam: 1.00 cm RV S prime:     12.70 cm/s TAPSE (M-mode): 1.7 cm LEFT ATRIUM             Index       RIGHT ATRIUM           Index LA diam:        2.80 cm 1.62 cm/m  RA Area:     12.00 cm LA Vol (A2C):   32.1 ml 18.54 ml/m RA Volume:   29.50 ml  17.04 ml/m LA Vol (A4C):   31.2 ml 18.02 ml/m LA Biplane Vol: 31.9 ml 18.43 ml/m  AORTIC VALVE AV Area (Vmax):    1.68 cm AV Area (Vmean):   1.61 cm AV Area (VTI):     1.82 cm AV Vmax:           163.00 cm/s AV Vmean:  111.000 cm/s AV VTI:            0.271 m AV Peak Grad:      10.6 mmHg AV Mean Grad:      6.0 mmHg LVOT Vmax:         96.30 cm/s LVOT Vmean:        62.900 cm/s LVOT VTI:          0.174 m LVOT/AV VTI ratio: 0.64 AI PHT:            449 msec  AORTA Ao Root diam: 2.75 cm Ao Asc diam:  2.60 cm MITRAL VALVE MV Area (PHT): 5.13 cm    SHUNTS MV Decel Time: 148 msec    Systemic VTI:  0.17 m MV E velocity: 88.60 cm/s  Systemic Diam: 1.90 cm MV A velocity: 74.40 cm/s MV E/A ratio:  1.19 Carlyle Dolly MD Electronically signed by Carlyle Dolly MD Signature Date/Time: 05/15/2021/4:47:11 PM    Final    Disposition   Pt is being discharged home today in good condition.  Follow-up Plans & Appointments     Follow-up Information    Deberah Pelton, NP Follow up on 05/26/2021.   Specialty: Cardiology Why: at 11:15am for your follow up appt Contact information: [address] [place] [address] [phone]              Discharge Instructions    Amb Referral to Cardiac Rehabilitation   Complete by: As directed    Will send Cardiac Rehab Phase 2 order to [facility]   Diagnosis:  Coronary  Stents NSTEMI     After initial evaluation and assessments completed: Virtual Based Care may be provided alone or in conjunction with Phase 2 Cardiac Rehab based on patient barriers.: Yes   Call MD for:  difficulty breathing, headache or visual disturbances   Complete by: As directed    Call MD for:  persistant dizziness or light-headedness   Complete by: As directed    Call MD for:  redness, tenderness, or signs of infection (pain, swelling, redness, odor or green/yellow discharge around incision site)   Complete by: As directed    Diet - low sodium heart healthy   Complete by: As directed    Discharge instructions   Complete by: As directed    Radial Site Care Refer to this sheet in the next few weeks. These instructions provide you with information on caring for yourself after your procedure. Your caregiver may also give you more specific instructions. Your treatment has been planned according to current medical practices, but problems sometimes occur. Call your caregiver if you have any problems or questions after your procedure. HOME CARE INSTRUCTIONS You may shower the day after the procedure.Remove the bandage (dressing) and gently wash the site with plain soap and water.Gently pat the site dry.  Do not apply powder or lotion to the site.  Do not submerge the affected site in water for 3 to 5 days.  Inspect the site at least twice daily.  Do not flex or bend the affected arm for 24 hours.  No lifting over 5 pounds (2.3 kg) for 5 days after your procedure.  Do not drive home if you are discharged the same day of the procedure. Have someone else drive you.  You may drive 24 hours after the procedure unless otherwise instructed by your caregiver.  What to expect: Any bruising will usually fade within 1 to 2 weeks.  Blood that collects in the  tissue (hematoma) may be painful to the touch. It should usually decrease in size and tenderness within 1 to 2 weeks.  SEEK IMMEDIATE MEDICAL  CARE IF: You have unusual pain at the radial site.  You have redness, warmth, swelling, or pain at the radial site.  You have drainage (other than a small amount of blood on the dressing).  You have chills.  You have a fever or persistent symptoms for more than 72 hours.  You have a fever and your symptoms suddenly get worse.  Your arm becomes pale, cool, tingly, or numb.  You have heavy bleeding from the site. Hold pressure on the site.   PLEASE DO NOT MISS ANY DOSES OF YOUR BRILINTA!!!!! Also keep a log of you blood pressures and bring back to your follow up appt. Please call the office with any questions.   Patients taking blood thinners should generally stay away from medicines like ibuprofen, Advil, Motrin, naproxen, and Aleve due to risk of stomach bleeding. You may take Tylenol as directed or talk to your primary doctor about alternatives.  PLEASE ENSURE THAT YOU DO NOT RUN OUT OF YOUR BRILINTA. This medication is very important to remain on for at least one year. IF you have issues obtaining this medication due to cost please CALL the office 3-5 business days prior to running out in order to prevent missing doses of this medication.   Increase activity slowly   Complete by: As directed       Discharge Medications   Allergies as of 05/17/2021   No Known Allergies     Medication List    STOP taking these medications   meloxicam 7.5 MG tablet Commonly known as: MOBIC     TAKE these medications   aspirin 81 MG EC tablet Take 1 tablet (81 mg total) by mouth daily. Swallow whole. Start taking on: May 18, 2021   atorvastatin 80 MG tablet Commonly known as: LIPITOR Take 1 tablet (80 mg total) by mouth daily. Start taking on: May 18, 2021   Biktarvy 50-200-25 MG Tabs tablet Generic drug: bictegravir-emtricitabine-tenofovir AF Take 1 tablet by mouth daily.   metoprolol tartrate 25 MG tablet Commonly known as: LOPRESSOR Take 1 tablet (25 mg total) by mouth 2 (two) times  daily.   nitroGLYCERIN 0.4 MG SL tablet Commonly known as: Nitrostat Place 1 tablet (0.4 mg total) under the tongue every 5 (five) minutes as needed.   ticagrelor 90 MG Tabs tablet Commonly known as: BRILINTA Take 1 tablet (90 mg total) by mouth 2 (two) times daily.          Outstanding Labs/Studies   FLP/LFTs in 8 weeks  Duration of Discharge Encounter   Greater than 30 minutes including physician time.  Signed, Reino Bellis, NP 05/17/2021, 11:59 AM   Patient seen and evaluated on the day of discharge.  Please see final progress note for full details of my examination.  Agree with discharge summary and plan.   Glenetta Hew, MD

## 2021-05-17 NOTE — Plan of Care (Signed)
Patient verbalized understanding  

## 2021-05-17 NOTE — Discharge Instructions (Addendum)
Heart Attack A heart attack occurs when blood and oxygen supply to the heart is cut off. A heart attack causes damage to the heart that cannot be fixed. A heart attack is also called a myocardial infarction, or MI. If you think you are having a heart attack, do not wait to see if the symptoms will go away. Get medical help right away. What are the causes? This condition may be caused by:  A fatty substance (plaque) in the blood vessels (arteries). This can block the flow of blood to the heart.  A blood clot in the blood vessels that go to the heart. The blood clot blocks blood flow.  Low blood pressure.  An abnormal heartbeat.  Some diseases, such as problems in red blood cells (anemia)orproblems in breathing (respiratory failure).  Tightening (spasm) of a blood vessel that cuts off blood to the heart.  A tear in a blood vessel of the heart.  High blood pressure.   What increases the risk? The following factors may make you more likely to develop this condition:  Aging. The older you are, the higher your risk.  Having a personal or family history of chest pain, heart attack, stroke, or narrowing of the arteries in the legs, arms, head, or stomach (peripheral artery disease).  Being male.  Smoking.  Not getting regular exercise.  Being overweight or obese.  Having high blood pressure.  Having high cholesterol.  Having diabetes.  Drinking too much alcohol.  Using illegal drugs, such as cocaine or methamphetamine. What are the signs or symptoms? Symptoms of this condition include:  Chest pain. It may feel like: ? Crushing or squeezing. ? Tightness, pressure, fullness, or heaviness.  Pain in the arm, neck, jaw, back, or upper body.  Shortness of breath.  Heartburn.  Upset stomach (indigestion).  Feeling like you may vomit (nauseous).  Cold sweats.  Feeling tired.  Sudden light-headedness. How is this treated? A heart attack must be treated as soon as  possible. Treatment may include:  Medicines to: ? Break up or dissolve blood clots. ? Thin blood and help prevent blood clots. ? Treat blood pressure. ? Improve blood flow to the heart. ? Reduce pain. ? Reduce cholesterol.  Procedures to widen a blocked artery and keep it open.  Open heart surgery.  Receiving oxygen.  Making your heart strong again (cardiac rehabilitation) through exercise, education, and counseling.   Follow these instructions at home: Medicines  Take over-the-counter and prescription medicines only as told by your doctor. You may need to take medicine: ? To keep your blood from clotting too easily. ? To control blood pressure. ? To lower cholesterol. ? To control heart rhythms.  Do not take these medicines unless your doctor says it is okay: ? NSAIDs, such as ibuprofen. ? Supplements that have vitamin A, vitamin E, or both. ? Hormone replacement therapy that has estrogen with or without progestin. Lifestyle  Do not use any products that have nicotine or tobacco, such as cigarettes, e-cigarettes, and chewing tobacco. If you need help quitting, ask your doctor.  Avoid secondhand smoke.  Exercise regularly. Ask your doctor about a cardiac rehab program.  Eat heart-healthy foods. Your doctor will tell you what foods to eat.  Stay at a healthy weight.  Lower your stress level.  Do not use illegal drugs.      Alcohol use  Do not drink alcohol if: ? Your doctor tells you not to drink. ? You are pregnant, may be pregnant,  or are planning to become pregnant.  If you drink alcohol: ? Limit how much you use to:  0-1 drink a day for women.  0-2 drinks a day for men. ? Know how much alcohol is in your drink. In the [place] one drink equals one 12 oz bottle of beer (355 mL), one 5 oz glass of wine (148 mL), or one 1 oz glass of hard liquor (44 mL). General instructions  Work with your doctor to treat other problems you may have, such as diabetes or  high blood pressure.  Get screened for depression. Get treatment if needed.  Keep your vaccines up to date. Get the flu shot (influenza vaccine) every year.  Keep all follow-up visits as told by your doctor. This is important. Contact a doctor if:  You feel very sad.  You have trouble doing your daily activities. Get help right away if:  You have sudden, unexplained discomfort in your chest, arms, back, neck, jaw, or upper body.  You have shortness of breath.  You have sudden sweating or clammy skin.  You feel like you may vomit.  You vomit.  You feel tired or weak.  You get light-headed or dizzy.  You feel your heart beating fast.  You feel your heart skipping beats.  You have blood pressure that is higher than 180/120. These symptoms may be an emergency. Do not wait to see if the symptoms will go away. Get medical help right away. Call your local emergency services (911 in the [place] Do not drive yourself to the hospital. Summary  A heart attack occurs when blood and oxygen supply to the heart is cut off.  Do not take NSAIDs unless your doctor says it is okay.  Do not smoke. Avoid secondhand smoke.  Exercise regularly. Ask your doctor about a cardiac rehab program. This information is not intended to replace advice given to you by your health care provider. Make sure you discuss any questions you have with your health care provider. Document Revised: 03/24/2019 Document Reviewed: 03/24/2019 Elsevier Patient Education  2021 Elsevier Inc.   Shortness of Breath, Adult Shortness of breath means you have trouble breathing. Shortness of breath could be a sign of a medical problem. Follow these instructions at home:  Watch for any changes in your symptoms.  Do not use any products that contain nicotine or tobacco, such as cigarettes, e-cigarettes, and chewing tobacco.  Do not smoke. Smoking can cause shortness of breath. If you need help to quit smoking, ask your  doctor.  Avoid things that can make it harder to breathe, such as: ? Mold. ? Dust. ? Air pollution. ? Chemical smells. ? Things that can cause allergy symptoms (allergens), if you have allergies.  Keep your living space clean. Use products that help remove mold and dust.  Rest as needed. Slowly return to your normal activities.  Take over-the-counter and prescription medicines only as told by your doctor. This includes oxygen therapy and inhaled medicines.  Keep all follow-up visits as told by your doctor. This is important.   Contact a doctor if:  Your condition does not get better as soon as expected.  You have a hard time doing your normal activities, even after you rest.  You have new symptoms. Get help right away if:  Your shortness of breath gets worse.  You have trouble breathing when you are resting.  You feel light-headed or you pass out (faint).  You have a cough that is not helped by  medicines.  You cough up blood.  You have pain with breathing.  You have pain in your chest, arms, shoulders, or belly (abdomen).  You have a fever.  You cannot walk up stairs.  You cannot exercise the way you normally do. These symptoms may represent a serious problem that is an emergency. Do not wait to see if the symptoms will go away. Get medical help right away. Call your local emergency services (911 in the [place] Do not drive yourself to the hospital. Summary  Shortness of breath is when you have trouble breathing enough air. It can be a sign of a medical problem.  Avoid things that make it hard for you to breathe, such as smoking, pollution, mold, and dust.  Watch for any changes in your symptoms. Contact your doctor if you do not get better or you get worse. This information is not intended to replace advice given to you by your health care provider. Make sure you discuss any questions you have with your health care provider. Document Revised: 05/13/2018 Document  Reviewed: 05/13/2018 Elsevier Patient Education  2021 Elsevier Inc. Information about your medication: Brilinta (anti-platelet agent)  Generic Name (Brand): ticagrelor (Brilinta), twice daily medication  PURPOSE: You are taking this medication along with aspirin to lower your chance of having a heart attack,stroke, or  blood clots in your heart stent. These can be fatal. Brilinta and aspirin help prevent platelets from sticking together and forming a clot that can block an artery or your stent.   Common SIDE EFFECTS you may experience include: bruising or bleeding more easily, shortness of breath  Do not stop taking BRILINTA without talking to the doctor who prescribes it for you. People who are treated with a stent and stop taking Brilinta too soon, have a higher risk of getting a blood clot in the stent, having a heart attack, or dying. If you stop Brilinta because of bleeding, or for other reasons, your risk of a heart attack or stroke may increase.   Tell all of your doctors and dentists that you are taking Brilinta. They should talk to the doctor who prescribed Brilinta for you before you have any surgery or invasive procedure.   Contact your health care provider if you experience: severe or uncontrollable bleeding, pink/red/brown urine, vomiting blood or vomit that looks like "coffee grounds", red or black stools (looks like tar), coughing up blood or blood clots ----------------------------------------------------------------------------------------------------------------------  Information about your medication: Statin (cholesterol-lowering agent)  Generic Name (Brand): atorvastatin (Lipitor)  PURPOSE: You are taking this medication to lower your "bad" cholesterol (LDL) and to prevent heart attacks and strokes. Statins can also raise your "good" cholesterol (HDL).  Common SIDE EFFECTS you may experience include: muscle pain or weakness (especially in the legs) and upset  stomach.  Take your medication exactly as prescribed. Do not eat large amounts of grapefruit or grapefruit juice while taking this medication.  Contact your health care provider if you experience: severe muscle pain that does not improve, dark urine, or yellowing of your skin or eyes. ----------------------------------------------------------------------------------------------------------------------

## 2021-05-18 ENCOUNTER — Telehealth (HOSPITAL_COMMUNITY): Payer: Self-pay

## 2021-05-18 NOTE — Telephone Encounter (Signed)
Pt was discharged 5/24 from the hospital  New meds include: DAPT (ASA, Brillinta) and Atorvaststain, BB was uptitrated  Left VM with pt

## 2021-05-20 ENCOUNTER — Telehealth: Payer: Self-pay | Admitting: Cardiology

## 2021-05-20 NOTE — Telephone Encounter (Signed)
[facility] received forms from [employer] / [employer]. Forms were given to Dr.Jordan nurse on 5/27.

## 2021-05-20 NOTE — Telephone Encounter (Signed)
Forms were put in [facility] box, patient has appt on 6/2

## 2021-05-24 ENCOUNTER — Telehealth (HOSPITAL_COMMUNITY): Payer: Self-pay | Admitting: Pharmacist

## 2021-05-24 NOTE — Progress Notes (Signed)
Cardiology Clinic Note   Patient Name: Christopher Mayo Date of Encounter: 05/26/2021  Primary Care Provider:  Higinio Plan, MD Primary Cardiologist:  Peter Swaziland, MD  Patient Profile    Christopher Mayo 50 year old male presents to the clinic today for follow-up evaluation status post NSTEMI.  Past Medical History    Past Medical History:  Diagnosis Date  . Anginal pain [facility])    Past Surgical History:  Procedure Laterality Date  . CORONARY STENT INTERVENTION N/A 05/15/2021   Procedure: CORONARY STENT INTERVENTION;  Surgeon: Swaziland, Peter M, MD;  Location: [facility] INVASIVE CV LAB;  Service: Cardiovascular;  Laterality: N/A;  . CORONARY/GRAFT ACUTE MI REVASCULARIZATION N/A 05/15/2021   Procedure: Coronary/Graft Acute MI Revascularization;  Surgeon: Swaziland, Peter M, MD;  Location: [facility] INVASIVE CV LAB;  Service: Cardiovascular;  Laterality: N/A;  . LEFT HEART CATH AND CORONARY ANGIOGRAPHY N/A 05/15/2021   Procedure: LEFT HEART CATH AND CORONARY ANGIOGRAPHY;  Surgeon: Swaziland, Peter M, MD;  Location: [facility] INVASIVE CV LAB;  Service: Cardiovascular;  Laterality: N/A;    Allergies  No Known Allergies  History of Present Illness    SWANSON FARNELL has a PMH of coronary artery disease, NSTEMI 05/15/2021 with DES to his proximal circumflex x1, hypertriglyceridemia, HIV, and tobacco use.  He presented to [facility] on 05/15/2021 after developing acute onset left upper neck pain and substernal pain on 05/14/2021.  He reported he had not experienced that type of pain before.  He had no prior history of coronary artery disease or other comorbidities aside from HIV which was diagnosed in his 30s.  His HIV has been well controlled.  He works at an [employer] and is fairly active.  He reported smoking several cigarettes per day and was smoking 2-3 cigarettes daily.  He also reported occasional marijuana use but denied other illicit drug use.  He denied a family  history of coronary artery disease or premature sudden cardiac death.  He was noted to have borderline ST elevations in his lateral leads and was also noted to have subtle elevations in V1 and V2 which were not present on his admission EKGs.  His troponin peaked at 3730 and trended down to 3110.  He developed recurrent chest pain while in the emergency department.  His repeat EKG showed ST elevation in his anterior lateral leads.  Code STEMI was called and he was taken to the cardiac Cath Lab where he was found to have a proximal circumflex lesion 100%.  He received DES x1.  He was placed on aspirin, Brilinta, atorvastatin, metoprolol.  He presents to the clinic for follow-up evaluation states he feels somewhat weak.  He has been fairly sedentary since his stent was placed.  He reports that his breathing is better.  He has had no more episodes of chest discomfort.  He has not returned to work.  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  We reviewed his angiography and stent placement.  We talked about smoking cessation, diet, and exercise.  He expressed understanding.  I will repeat his fasting lipids and LFTs in 6 weeks, have him increase his physical activity, and follow-up with Dr. Swaziland in 3 months.  Today he denies chest pain, shortness of breath, lower extremity edema, fatigue, palpitations, melena, hematuria, hemoptysis, diaphoresis, weakness, presyncope, syncope, orthopnea, and PND.  Home Medications    Prior to Admission medications   Medication Sig Start Date End Date Taking? Authorizing Provider  aspirin 81 MG EC  tablet Take 1 tablet (81 mg total) by mouth daily. Swallow whole. 05/18/21   Arty Baumgartner, NP  atorvastatin (LIPITOR) 80 MG tablet Take 1 tablet (80 mg total) by mouth daily. 05/18/21   Arty Baumgartner, NP  BIKTARVY 50-200-25 MG TABS tablet Take 1 tablet by mouth daily. 04/18/21   [provider]  metoprolol tartrate (LOPRESSOR) 25 MG tablet Take 1 tablet (25 mg total) by mouth  2 (two) times daily. 05/17/21   Arty Baumgartner, NP  nitroGLYCERIN (NITROSTAT) 0.4 MG SL tablet Place 1 tablet (0.4 mg total) under the tongue every 5 (five) minutes as needed. 05/17/21   Arty Baumgartner, NP  ticagrelor (BRILINTA) 90 MG TABS tablet Take 1 tablet (90 mg total) by mouth 2 (two) times daily. 05/17/21   Arty Baumgartner, NP    Family History    History reviewed. No pertinent family history. has no family status information on file.   Social History    Social History   Socioeconomic History  . Marital status: Single    Spouse name: Not on file  . Number of children: Not on file  . Years of education: Not on file  . Highest education level: Not on file  Occupational History  . Not on file  Tobacco Use  . Smoking status: Current Every Day Smoker    Packs/day: 0.25    Years: 35.00    Pack years: 8.75  . Smokeless tobacco: Never Used  Substance and Sexual Activity  . Alcohol use: Not Currently  . Drug use: Not on file  . Sexual activity: Not on file  Other Topics Concern  . Not on file  Social History Narrative  . Not on file   Social Determinants of Health   Financial Resource Strain: Not on file  Food Insecurity: Not on file  Transportation Needs: Not on file  Physical Activity: Not on file  Stress: Not on file  Social Connections: Not on file  Intimate Partner Violence: Not on file     Review of Systems    General:  No chills, fever, night sweats or weight changes.  Cardiovascular:  No chest pain, dyspnea on exertion, edema, orthopnea, palpitations, paroxysmal nocturnal dyspnea. Dermatological: No rash, lesions/masses Respiratory: No cough, dyspnea Urologic: No hematuria, dysuria Abdominal:   No nausea, vomiting, diarrhea, bright red blood per rectum, melena, or hematemesis Neurologic:  No visual changes, wkns, changes in mental status. All other systems reviewed and are otherwise negative except as noted above.  Physical Exam    VS:  BP  98/60 (BP Location: Left Arm)   Pulse 65   Ht 5\' 6"  (1.676 m)   Wt 143 lb 12.8 oz (65.2 kg)   SpO2 99%   BMI 23.21 kg/m  , BMI Body mass index is 23.21 kg/m. GEN: Well nourished, well developed, in no acute distress. HEENT: normal. Neck: Supple, no JVD, carotid bruits, or masses. Cardiac: RRR, no murmurs, rubs, or gallops. No clubbing, cyanosis, edema.  Radials/DP/PT 2+ and equal bilaterally.  Respiratory:  Respirations regular and unlabored, clear to auscultation bilaterally. GI: Soft, nontender, nondistended, BS + x 4. MS: no deformity or atrophy. Skin: warm and dry, no rash. Neuro:  Strength and sensation are intact. Psych: Normal affect.  Accessory Clinical Findings    Recent Labs: 05/15/2021: TSH 1.049 05/16/2021: BUN 11; Creatinine, Ser 1.04; Potassium 3.8; Sodium 135 05/17/2021: Hemoglobin 12.0; Platelets 277   Recent Lipid Panel    Component Value Date/Time   CHOL  112 05/15/2021 0705   TRIG 33 05/15/2021 0705   HDL 44 05/15/2021 0705   CHOLHDL 2.5 05/15/2021 0705   VLDL 7 05/15/2021 0705   LDLCALC 61 05/15/2021 0705    ECG personally reviewed by me today-normal sinus rhythm possible left atrial enlargement incomplete right bundle branch block ST and T wave abnormality consider inferior lateral ischemia 61 bpm- No acute changes  Echocardiogram 05/15/2021 IMPRESSIONS    1. Left ventricular ejection fraction, by estimation, is 55 to 60%. The  left ventricle has normal function. The left ventricle demonstrates  regional wall motion abnormalities (see scoring diagram/findings for  description). Left ventricular diastolic  parameters are indeterminate.  2. Right ventricular systolic function is normal. The right ventricular  size is normal.  3. The mitral valve is normal in structure. Mild mitral valve  regurgitation. No evidence of mitral stenosis.  4. The aortic valve is tricuspid. Aortic valve regurgitation is moderate.  No aortic stenosis is  present.  Cardiac catheterization 05/15/2021  Prox RCA to Mid RCA lesion is 25% stenosed.  Prox Cx lesion is 100% stenosed.  A drug-eluting stent was successfully placed using a SYNERGY XD [id number].  Post intervention, there is a 0% residual stenosis.  LV end diastolic pressure is mildly elevated.   1. Single vessel occlusive CAD involving the proximal LCx 2. Mildly elevated LVEDP 3. Successful PCI of the proximal LCx with DES x 1  Plan: DAPT for one year. Assess LV function by Echo.   Diagnostic Dominance: Right    Intervention       Assessment & Plan   1.  STEMI-no chest pain today.  Underwent cardiac catheterization on 05/15/2021 and received DES x1 to his proximal circumflex.  Right radial site clean dry intact and healed well.  He may return to work with no restrictions. Continue metoprolol, nitroglycerin, Brilinta, atorvastatin, aspirin Heart healthy low-sodium diet-salty 6 given Increase physical activity as tolerated Continue aspirin, Brilinta, metoprolol, atorvastatin, nitroglycerin Heart healthy low-sodium diet-salty 6 given Increase physical activity as tolerated  Hypertriglyceridemia-05/15/2021: Cholesterol 112; HDL 44; LDL Cholesterol 61; Triglycerides 33; VLDL 7 Continue atorvastatin, aspirin Heart healthy low-sodium high-fiber diet Increase physical activity as tolerated Lipid and Lft's in 6 weeks   Tobacco abuse- reports smoking cessation.  Congratulated on cessation. Continued cessation reviewed and recommended.  HIV-diagnosed in his 30s and has been compliant with medical management. Follows with PCP  Disposition: Follow-up with Dr. Swaziland in 3 months.     Thomasene Ripple. Chanita Boden NP-C    05/26/2021, 9:36 AM [facility] [address] Office [phone] Fax [phone]  Notice: This dictation was prepared with Dragon dictation along with smaller phrase technology. Any transcriptional errors that result  from this process are unintentional and may not be corrected upon review.  I spent 14 minutes examining this patient, reviewing medications, and using patient centered shared decision making involving her cardiac care.  Prior to her visit I spent greater than 20 minutes reviewing her past medical history,  medications, and prior cardiac tests.

## 2021-05-24 NOTE — Telephone Encounter (Signed)
Unable to reach patient.  Left VMS.  Final Attempt.

## 2021-05-26 ENCOUNTER — Ambulatory Visit: Payer: No Typology Code available for payment source | Admitting: General Practice

## 2021-05-26 ENCOUNTER — Other Ambulatory Visit: Payer: Self-pay

## 2021-05-26 ENCOUNTER — Encounter: Payer: Self-pay | Admitting: General Practice

## 2021-05-26 VITALS — BP 98/60 | HR 65 | Ht 66.0 in | Wt 143.8 lb

## 2021-05-26 DIAGNOSIS — Z720 Tobacco use: Secondary | ICD-10-CM | POA: Diagnosis not present

## 2021-05-26 DIAGNOSIS — I2121 ST elevation (STEMI) myocardial infarction involving left circumflex coronary artery: Secondary | ICD-10-CM

## 2021-05-26 DIAGNOSIS — Z21 Asymptomatic human immunodeficiency virus [HIV] infection status: Secondary | ICD-10-CM

## 2021-05-26 DIAGNOSIS — E781 Pure hyperglyceridemia: Secondary | ICD-10-CM

## 2021-05-26 DIAGNOSIS — Z0279 Encounter for issue of other medical certificate: Secondary | ICD-10-CM

## 2021-05-26 DIAGNOSIS — B20 Human immunodeficiency virus [HIV] disease: Secondary | ICD-10-CM

## 2021-05-26 DIAGNOSIS — Z79899 Other long term (current) drug therapy: Secondary | ICD-10-CM

## 2021-05-26 NOTE — Patient Instructions (Signed)
Medication Instructions:  The current medical regimen is effective;  continue present plan and medications as directed. Please refer to the Current Medication list given to you today. *If you need a refill on your cardiac medications before your next appointment, please call your pharmacy*  Lab Work: FASTING LIPID AND LFT IN 6 WEEKS ABOUT 06-07-2021 If you have labs (blood work) drawn today and your tests are completely normal, you will receive your results only by:  MyChart Message (if you have MyChart) OR A paper copy in the mail.  If you have any lab test that is abnormal or we need to change your treatment, we will call you to review the results. You may go to any [facility] that is convenient for you however, we do have a lab in our office that is able to assist you. You DO NOT need an appointment for our lab. The lab is open 8:00am and closes at 4:00pm. Lunch 12:45 - 1:45pm.  Special Instructions MAY RETURN TO FULL DUTY 06-01-2021  PLEASE READ AND FOLLOW SALTY 6-ATTACHED-1,800 mg daily  PLEASE INCREASE PHYSICAL ACTIVITY AS TOLERATED; GOAL IS 150 MINUTES OF MODERATE ACTIVITY WEEKLY  Follow-Up: Your next appointment:  3 month(s) In Person with Peter Swaziland, MD OR IF UNAVAILABLE JESSE CLEAVER, FNP-C   At [facility], you and your health needs are our priority.  As part of our continuing mission to provide you with exceptional heart care, we have created designated Provider Care Teams.  These Care Teams include your primary Cardiologist (physician) and Advanced Practice Providers (APPs -  Physician Assistants and Nurse Practitioners) who all work together to provide you with the care you need, when you need it.

## 2021-05-27 ENCOUNTER — Telehealth: Payer: Self-pay | Admitting: Cardiology

## 2021-05-27 ENCOUNTER — Telehealth (HOSPITAL_COMMUNITY): Payer: Self-pay

## 2021-05-27 NOTE — Telephone Encounter (Signed)
Spoke to patient he stated FMLA form completed was denied due to wrong diagnosis code.Stated he needs this to be taken care of today.He needs his paycheck.Advised I will send to message to Edd Fabian NP.Stated he will be coming to office this afternoon.

## 2021-05-27 NOTE — Telephone Encounter (Signed)
My chart message sent to pt.

## 2021-05-27 NOTE — Telephone Encounter (Signed)
Called patient left message on personal voice mail to call back. 

## 2021-05-27 NOTE — Telephone Encounter (Signed)
Corrected and refaxed

## 2021-05-27 NOTE — Telephone Encounter (Signed)
Forms were completed and received from provider, forms were also faxed on 6/2.

## 2021-05-27 NOTE — Telephone Encounter (Signed)
Per cardiac rehab phase I, fax cardiac rehab referral to [facility] cardiac rehab.

## 2021-05-27 NOTE — Telephone Encounter (Signed)
Patient called to say that he is being denied for his disability because the wrong diagnose code is being enter for him. He needs to be resolve asap. Please advise

## 2021-05-30 ENCOUNTER — Telehealth: Payer: Self-pay

## 2021-05-30 NOTE — Telephone Encounter (Signed)
Received a call from patient stating [employer] has not received FMLA forms.Stated he has not received a copy either.Advised I located [employer] forms.He is asking about a different [employer] form.Stated he needs [place] physician statement form.Stated he will come to office tomorrow 6/7 to pick up.Advised I will send message to [facility] nurse.

## 2021-05-31 NOTE — Telephone Encounter (Signed)
Late entry refaxed and re-emailed these forms to pt's personal email and faxed again to fax number on the forms at about  745am today.

## 2021-06-01 NOTE — Telephone Encounter (Signed)
All forms , and payment complete.

## 2021-06-13 ENCOUNTER — Other Ambulatory Visit (HOSPITAL_COMMUNITY): Payer: Self-pay

## 2021-06-14 ENCOUNTER — Other Ambulatory Visit (HOSPITAL_COMMUNITY): Payer: Self-pay

## 2021-07-06 LAB — LIPID PANEL
Chol/HDL Ratio: 2.5 ratio (ref 0.0–5.0)
Cholesterol, Total: 78 mg/dL — ABNORMAL LOW (ref 100–199)
HDL: 31 mg/dL — ABNORMAL LOW (ref >39)
LDL Chol Calc (NIH): 24 mg/dL (ref 0–99)
Triglycerides: 128 mg/dL (ref 0–149)
VLDL Cholesterol Cal: 23 mg/dL (ref 5–40)

## 2021-07-06 LAB — HEPATIC FUNCTION PANEL
ALT: 58 IU/L — ABNORMAL HIGH (ref 0–44)
AST: 31 IU/L (ref 0–40)
Albumin: 4.7 g/dL (ref 4.0–5.0)
Alkaline Phosphatase: 133 IU/L — ABNORMAL HIGH (ref 44–121)
Bilirubin Total: 0.4 mg/dL (ref 0.0–1.2)
Bilirubin, Direct: <0.1 mg/dL (ref 0.00–0.40)
Total Protein: 6.9 g/dL (ref 6.0–8.5)

## 2021-07-11 ENCOUNTER — Other Ambulatory Visit: Payer: Self-pay

## 2021-07-11 DIAGNOSIS — E781 Pure hyperglyceridemia: Secondary | ICD-10-CM

## 2021-07-11 DIAGNOSIS — I2121 ST elevation (STEMI) myocardial infarction involving left circumflex coronary artery: Secondary | ICD-10-CM

## 2021-07-11 DIAGNOSIS — Z79899 Other long term (current) drug therapy: Secondary | ICD-10-CM

## 2021-07-11 MED ORDER — ATORVASTATIN CALCIUM 40 MG PO TABS: 40.0000 mg | ORAL_TABLET | Freq: Every day | ORAL | 1 refills | Status: AC

## 2021-09-02 ENCOUNTER — Ambulatory Visit: Payer: No Typology Code available for payment source | Admitting: General Practice

## 2021-10-03 ENCOUNTER — Ambulatory Visit: Payer: No Typology Code available for payment source | Admitting: Cardiology

## 2023-03-14 IMAGING — CR DG CHEST 2V
2 series · 2 of 2 positions shown · non-contrast
Comparison: None.

CLINICAL DATA: Chest pain radiating to the left shoulder.

EXAM:
CHEST - 2 VIEW

[chest pa]
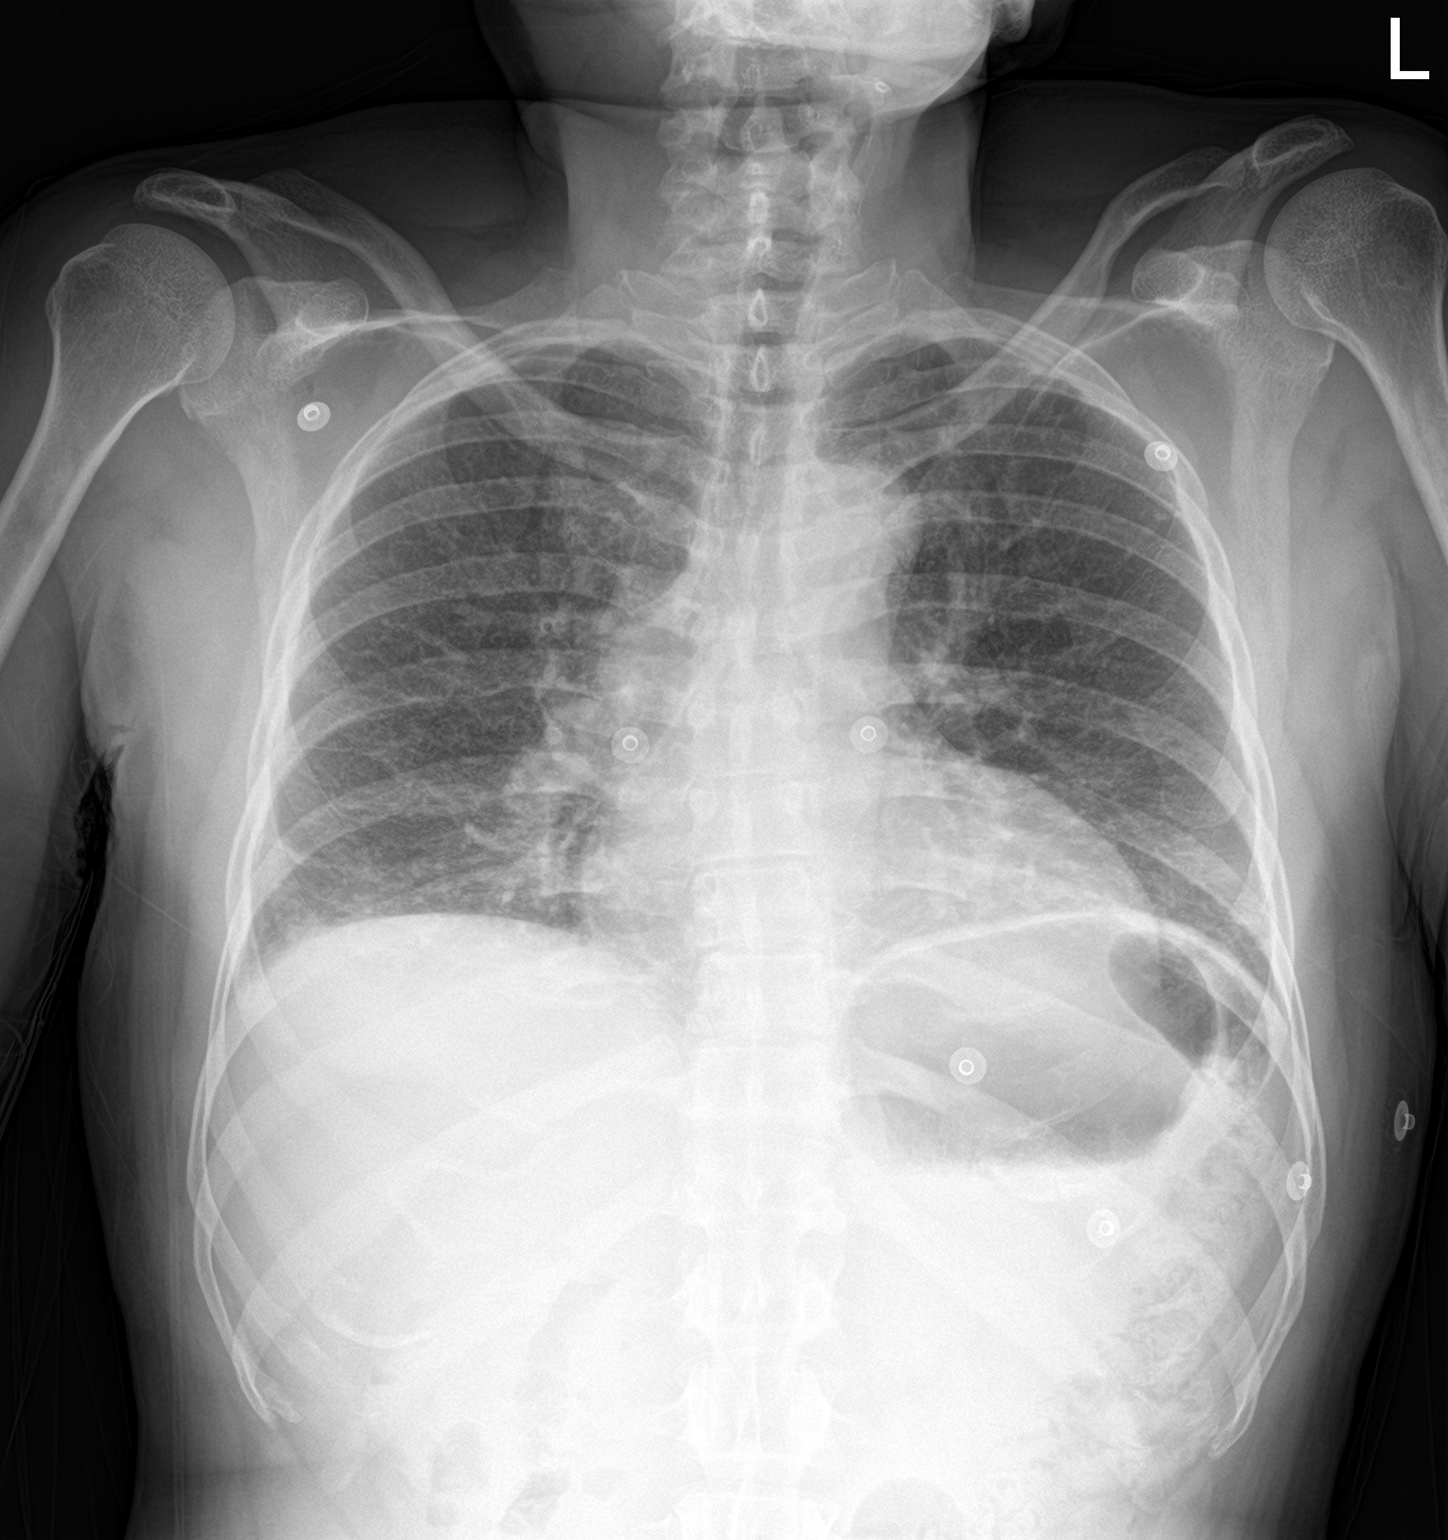

[chest lat]
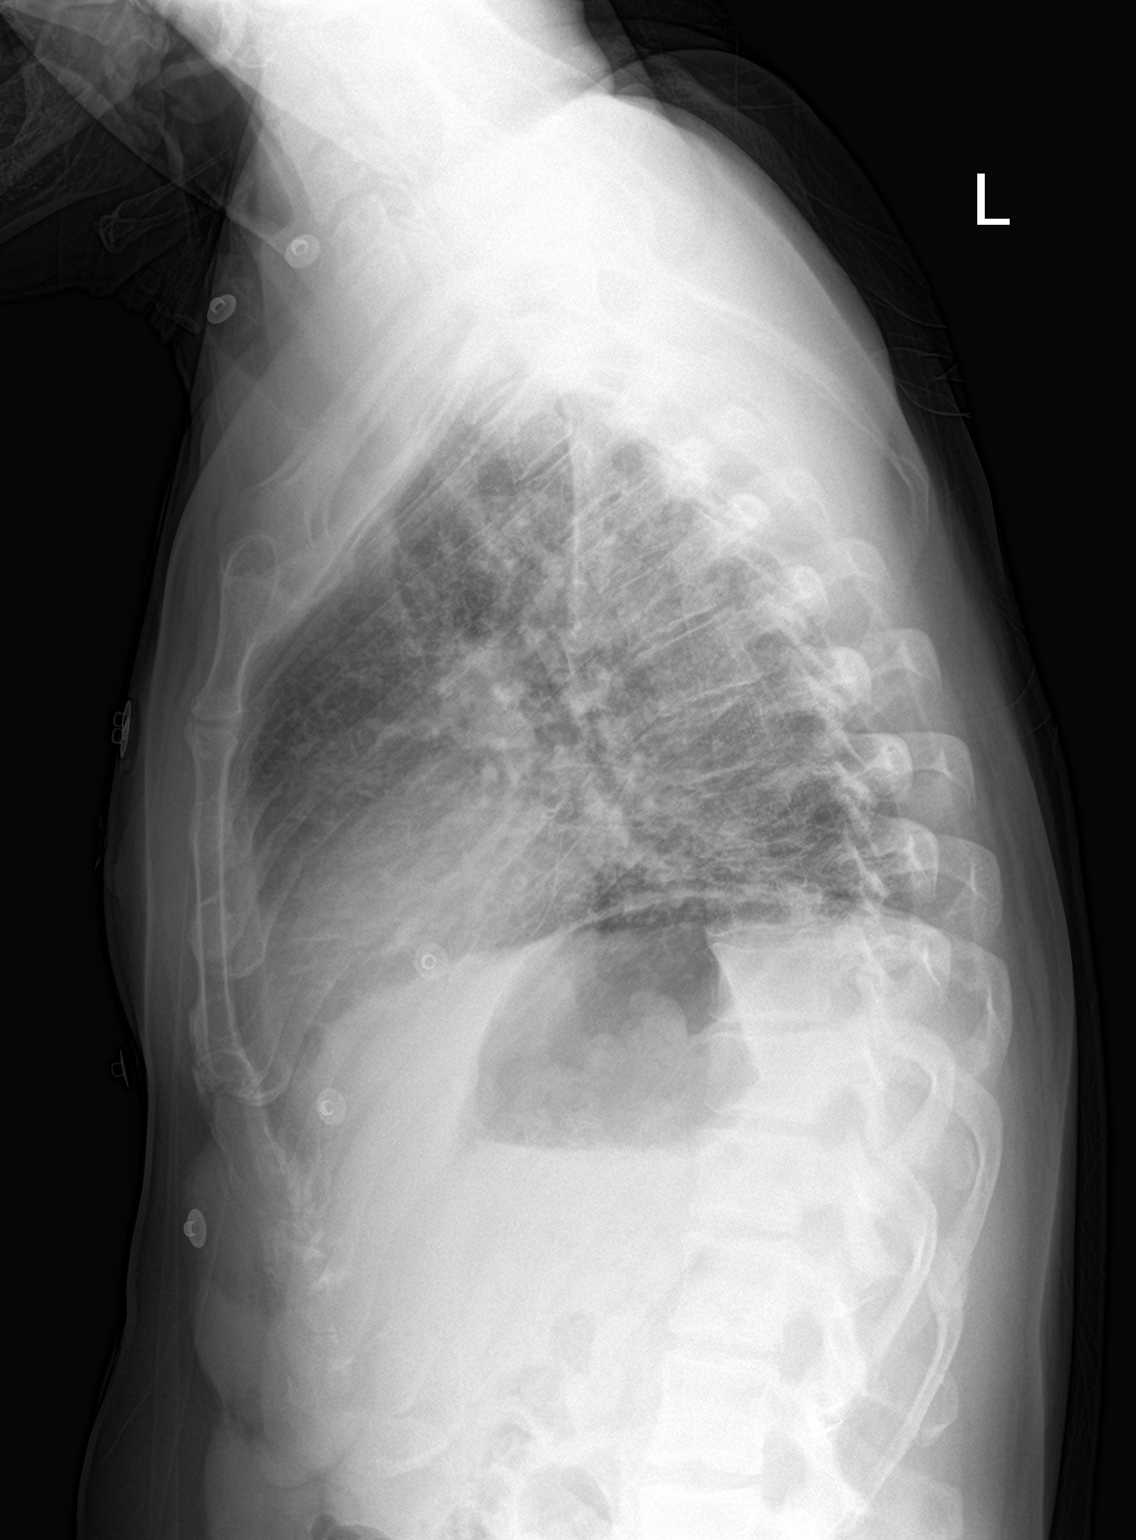

[2 of 2 positions shown; findings below may reference images not displayed]

FINDINGS: Low lung volumes are seen with mild areas of atelectasis noted
within the bilateral lung bases. There is no evidence of a pleural
effusion or pneumothorax. The heart size and mediastinal contours
are within normal limits. The visualized skeletal structures are
unremarkable.
IMPRESSION: Low lung volumes with mild bibasilar atelectasis.
